# Patient Record
Sex: Male | Born: 1960 | Race: White | Hispanic: No | Marital: Married | Smoking: Never smoker
Health system: Southern US, Community
[De-identification: ages and names within clinical notes are randomized; demographics above are authoritative.]

## PROBLEM LIST (undated history)

## (undated) DIAGNOSIS — I10 Essential (primary) hypertension: Secondary | ICD-10-CM

## (undated) DIAGNOSIS — E119 Type 2 diabetes mellitus without complications: Secondary | ICD-10-CM

## (undated) DIAGNOSIS — Z8719 Personal history of other diseases of the digestive system: Secondary | ICD-10-CM

## (undated) DIAGNOSIS — Z7901 Long term (current) use of anticoagulants: Secondary | ICD-10-CM

## (undated) DIAGNOSIS — I4891 Unspecified atrial fibrillation: Secondary | ICD-10-CM

## (undated) DIAGNOSIS — J189 Pneumonia, unspecified organism: Secondary | ICD-10-CM

## (undated) DIAGNOSIS — N529 Male erectile dysfunction, unspecified: Secondary | ICD-10-CM

## (undated) DIAGNOSIS — N181 Chronic kidney disease, stage 1: Secondary | ICD-10-CM

## (undated) DIAGNOSIS — S22009A Unspecified fracture of unspecified thoracic vertebra, initial encounter for closed fracture: Secondary | ICD-10-CM

## (undated) DIAGNOSIS — S22008A Other fracture of unspecified thoracic vertebra, initial encounter for closed fracture: Secondary | ICD-10-CM

## (undated) DIAGNOSIS — A419 Sepsis, unspecified organism: Secondary | ICD-10-CM

## (undated) DIAGNOSIS — G4733 Obstructive sleep apnea (adult) (pediatric): Secondary | ICD-10-CM

## (undated) DIAGNOSIS — R17 Unspecified jaundice: Secondary | ICD-10-CM

## (undated) DIAGNOSIS — I482 Chronic atrial fibrillation, unspecified: Secondary | ICD-10-CM

## (undated) DIAGNOSIS — R310 Gross hematuria: Secondary | ICD-10-CM

## (undated) DIAGNOSIS — S42109A Fracture of unspecified part of scapula, unspecified shoulder, initial encounter for closed fracture: Secondary | ICD-10-CM

## (undated) DIAGNOSIS — E785 Hyperlipidemia, unspecified: Secondary | ICD-10-CM

## (undated) DIAGNOSIS — S2242XA Multiple fractures of ribs, left side, initial encounter for closed fracture: Secondary | ICD-10-CM

## (undated) DIAGNOSIS — J942 Hemothorax: Secondary | ICD-10-CM

## (undated) DIAGNOSIS — E876 Hypokalemia: Secondary | ICD-10-CM

## (undated) DIAGNOSIS — S36899A Unspecified injury of other intra-abdominal organs, initial encounter: Secondary | ICD-10-CM

## (undated) DIAGNOSIS — K8020 Calculus of gallbladder without cholecystitis without obstruction: Secondary | ICD-10-CM

## (undated) DIAGNOSIS — E118 Type 2 diabetes mellitus with unspecified complications: Secondary | ICD-10-CM

## (undated) DIAGNOSIS — I6350 Cerebral infarction due to unspecified occlusion or stenosis of unspecified cerebral artery: Secondary | ICD-10-CM

## (undated) DIAGNOSIS — D62 Acute posthemorrhagic anemia: Secondary | ICD-10-CM

## (undated) DIAGNOSIS — I639 Cerebral infarction, unspecified: Secondary | ICD-10-CM

## (undated) HISTORY — DX: Unspecified jaundice: R17

## (undated) HISTORY — DX: Cerebral infarction, unspecified: I63.9

## (undated) HISTORY — DX: Cerebral infarction due to unspecified occlusion or stenosis of unspecified cerebral artery: I63.50

## (undated) HISTORY — DX: Fracture of unspecified part of scapula, unspecified shoulder, initial encounter for closed fracture: S42.109A

## (undated) HISTORY — DX: Chronic kidney disease, stage 1: N18.1

## (undated) HISTORY — DX: Gross hematuria: R31.0

## (undated) HISTORY — DX: Hemothorax: J94.2

## (undated) HISTORY — DX: Other fracture of unspecified thoracic vertebra, initial encounter for closed fracture: S22.008A

## (undated) HISTORY — DX: Unspecified injury of other intra-abdominal organs, initial encounter: S36.899A

## (undated) HISTORY — DX: Unspecified atrial fibrillation: I48.91

## (undated) HISTORY — DX: Chronic atrial fibrillation, unspecified: I48.20

## (undated) HISTORY — DX: Obstructive sleep apnea (adult) (pediatric): G47.33

## (undated) HISTORY — DX: Hypokalemia: E87.6

## (undated) HISTORY — DX: Pneumonia, unspecified organism: J18.9

## (undated) HISTORY — DX: Hyperlipidemia, unspecified: E78.5

## (undated) HISTORY — DX: Essential (primary) hypertension: I10

## (undated) HISTORY — DX: Personal history of other diseases of the digestive system: Z87.19

## (undated) HISTORY — PX: TONSILLECTOMY: SHX5217

## (undated) HISTORY — DX: Male erectile dysfunction, unspecified: N52.9

## (undated) HISTORY — DX: Type 2 diabetes mellitus with unspecified complications: E11.8

## (undated) HISTORY — DX: Calculus of gallbladder without cholecystitis without obstruction: K80.20

## (undated) HISTORY — DX: Multiple fractures of ribs, left side, initial encounter for closed fracture: S22.42XA

## (undated) HISTORY — DX: Long term (current) use of anticoagulants: Z79.01

## (undated) HISTORY — DX: Type 2 diabetes mellitus without complications: E11.9

## (undated) HISTORY — DX: Acute posthemorrhagic anemia: D62

## (undated) HISTORY — DX: Sepsis, unspecified organism: A41.9

## (undated) HISTORY — DX: Unspecified fracture of unspecified thoracic vertebra, initial encounter for closed fracture: S22.009A

---

## 2004-05-17 ENCOUNTER — Inpatient Hospital Stay (HOSPITAL_COMMUNITY): Admission: EM | Admit: 2004-05-17 | Discharge: 2004-05-19 | Payer: Self-pay | Admitting: Emergency Medicine

## 2006-05-20 HISTORY — PX: CHOLECYSTECTOMY: SHX55

## 2007-06-30 ENCOUNTER — Inpatient Hospital Stay (HOSPITAL_COMMUNITY): Admission: EM | Admit: 2007-06-30 | Discharge: 2007-07-05 | Payer: Self-pay | Admitting: Emergency Medicine

## 2007-06-30 ENCOUNTER — Ambulatory Visit (HOSPITAL_COMMUNITY): Admission: RE | Admit: 2007-06-30 | Discharge: 2007-06-30 | Payer: Self-pay | Admitting: Gastroenterology

## 2007-06-30 ENCOUNTER — Encounter (INDEPENDENT_AMBULATORY_CARE_PROVIDER_SITE_OTHER): Payer: Self-pay | Admitting: Gastroenterology

## 2007-07-15 ENCOUNTER — Ambulatory Visit: Payer: Self-pay | Admitting: Cardiology

## 2007-07-15 ENCOUNTER — Ambulatory Visit (HOSPITAL_COMMUNITY): Admission: RE | Admit: 2007-07-15 | Discharge: 2007-07-16 | Payer: Self-pay | Admitting: Surgery

## 2007-07-22 ENCOUNTER — Ambulatory Visit: Payer: Self-pay

## 2007-07-22 ENCOUNTER — Encounter: Payer: Self-pay | Admitting: Cardiology

## 2007-08-13 ENCOUNTER — Encounter (INDEPENDENT_AMBULATORY_CARE_PROVIDER_SITE_OTHER): Payer: Self-pay | Admitting: Surgery

## 2007-08-13 ENCOUNTER — Ambulatory Visit (HOSPITAL_COMMUNITY): Admission: RE | Admit: 2007-08-13 | Discharge: 2007-08-13 | Payer: Self-pay | Admitting: Surgery

## 2007-08-21 ENCOUNTER — Encounter: Admission: RE | Admit: 2007-08-21 | Discharge: 2007-08-21 | Payer: Self-pay | Admitting: Surgery

## 2007-08-24 ENCOUNTER — Ambulatory Visit: Payer: Self-pay | Admitting: Internal Medicine

## 2007-08-24 ENCOUNTER — Inpatient Hospital Stay (HOSPITAL_COMMUNITY): Admission: EM | Admit: 2007-08-24 | Discharge: 2007-08-26 | Payer: Self-pay | Admitting: Emergency Medicine

## 2007-11-05 ENCOUNTER — Encounter: Payer: Self-pay | Admitting: Cardiovascular Disease

## 2007-11-05 LAB — CONVERTED CEMR LAB
ALT: 25 units/L
AST: 23 units/L
Albumin: 4.1 g/dL
BUN: 17 mg/dL
CO2: 32 meq/L
Calcium: 9.1 mg/dL
Chloride: 108 meq/L
Cholesterol: 164 mg/dL
Creatinine, Ser: 0.9 mg/dL
Direct LDL: 47 mg/dL
GFR calc Af Amer: 108.98 mL/min
GFR calc non Af Amer: 90.06 mL/min
HDL: 3.49 mg/dL
Potassium: 3.9 meq/L
Sodium: 144 meq/L
Total Bilirubin: 0.7 mg/dL
Total Protein: 6.8 g/dL
Triglyceride fasting, serum: 60 mg/dL

## 2009-03-20 ENCOUNTER — Encounter: Admission: RE | Admit: 2009-03-20 | Discharge: 2009-03-20 | Payer: Self-pay | Admitting: Family Medicine

## 2009-08-16 ENCOUNTER — Encounter: Payer: Self-pay | Admitting: Cardiovascular Disease

## 2009-08-17 ENCOUNTER — Encounter: Payer: Self-pay | Admitting: Cardiovascular Disease

## 2009-08-17 LAB — CONVERTED CEMR LAB
Basophils Relative: 0 %
Eosinophils Relative: 0 %
HCT: 43 %
Hemoglobin: 14.7 g/dL
INR: 0.99
Lymphocytes, automated: 16 %
MCV: 86 fL
Monocytes Relative: 5 %
Neutrophils Relative %: 79 %
Platelets: 193 10*3/uL
Prothrombin Time: 10 s
RBC: 5.02 M/uL
RDW: 13.6 %
WBC: 9.5 10*3/uL

## 2009-08-21 ENCOUNTER — Ambulatory Visit: Payer: Self-pay | Admitting: Cardiovascular Disease

## 2009-08-29 ENCOUNTER — Telehealth: Payer: Self-pay | Admitting: Cardiology

## 2009-08-30 DIAGNOSIS — I482 Chronic atrial fibrillation, unspecified: Secondary | ICD-10-CM

## 2009-08-30 DIAGNOSIS — I6350 Cerebral infarction due to unspecified occlusion or stenosis of unspecified cerebral artery: Secondary | ICD-10-CM | POA: Insufficient documentation

## 2009-08-30 DIAGNOSIS — R17 Unspecified jaundice: Secondary | ICD-10-CM | POA: Insufficient documentation

## 2009-08-30 DIAGNOSIS — I10 Essential (primary) hypertension: Secondary | ICD-10-CM

## 2009-08-30 DIAGNOSIS — I119 Hypertensive heart disease without heart failure: Secondary | ICD-10-CM

## 2009-08-30 DIAGNOSIS — E118 Type 2 diabetes mellitus with unspecified complications: Secondary | ICD-10-CM

## 2009-08-30 DIAGNOSIS — E785 Hyperlipidemia, unspecified: Secondary | ICD-10-CM

## 2009-08-30 DIAGNOSIS — Z8719 Personal history of other diseases of the digestive system: Secondary | ICD-10-CM

## 2009-08-30 DIAGNOSIS — N529 Male erectile dysfunction, unspecified: Secondary | ICD-10-CM

## 2009-08-30 DIAGNOSIS — N181 Chronic kidney disease, stage 1: Secondary | ICD-10-CM

## 2009-08-30 HISTORY — DX: Male erectile dysfunction, unspecified: N52.9

## 2009-08-30 HISTORY — DX: Chronic atrial fibrillation, unspecified: I48.20

## 2009-08-30 HISTORY — DX: Type 2 diabetes mellitus with unspecified complications: E11.8

## 2009-08-30 HISTORY — DX: Cerebral infarction due to unspecified occlusion or stenosis of unspecified cerebral artery: I63.50

## 2009-08-30 HISTORY — DX: Hyperlipidemia, unspecified: E78.5

## 2009-08-30 HISTORY — DX: Chronic kidney disease, stage 1: N18.1

## 2009-08-30 HISTORY — DX: Essential (primary) hypertension: I10

## 2009-08-30 HISTORY — DX: Personal history of other diseases of the digestive system: Z87.19

## 2009-08-30 HISTORY — DX: Unspecified jaundice: R17

## 2009-09-05 ENCOUNTER — Encounter: Payer: Self-pay | Admitting: Cardiovascular Disease

## 2009-09-13 ENCOUNTER — Telehealth (INDEPENDENT_AMBULATORY_CARE_PROVIDER_SITE_OTHER): Payer: Self-pay | Admitting: *Deleted

## 2009-09-13 ENCOUNTER — Encounter: Payer: Self-pay | Admitting: Cardiovascular Disease

## 2009-12-31 IMAGING — CR DG ERCP WO/W SPHINCTEROTOMY
5 series · 11 of 11 positions shown · non-contrast
Comparison: none

CLINICAL DATA: Gallstones. Abdominal pain.

ERCP:
Images during ERCP performed by the clinical service are submitted.

[Series 1: cont. · 2 of 2 slices shown (1 of 5)]
[im 1/2]
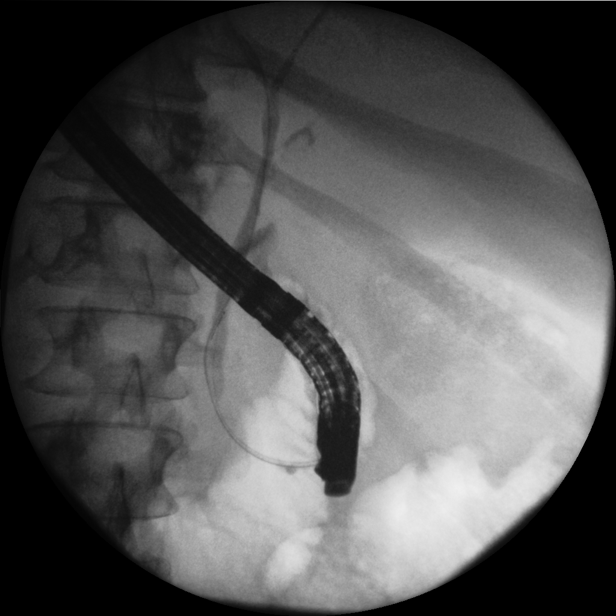
[im 2/2]
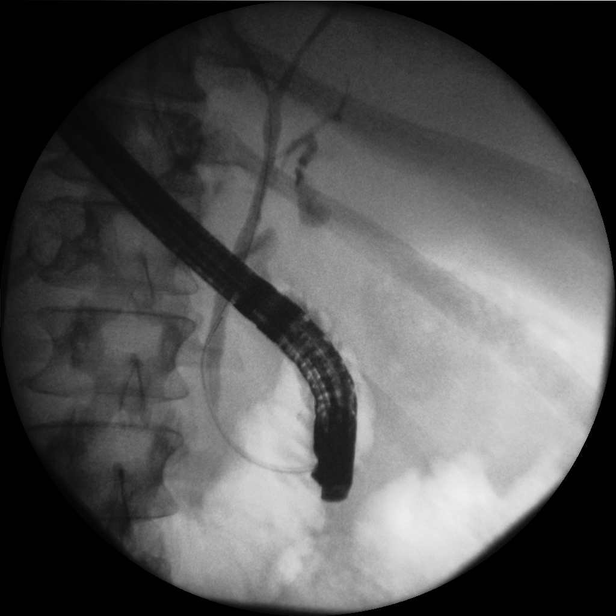

[Series 2: cont. · 2 of 2 slices shown (2 of 5)]
[im 1/2]
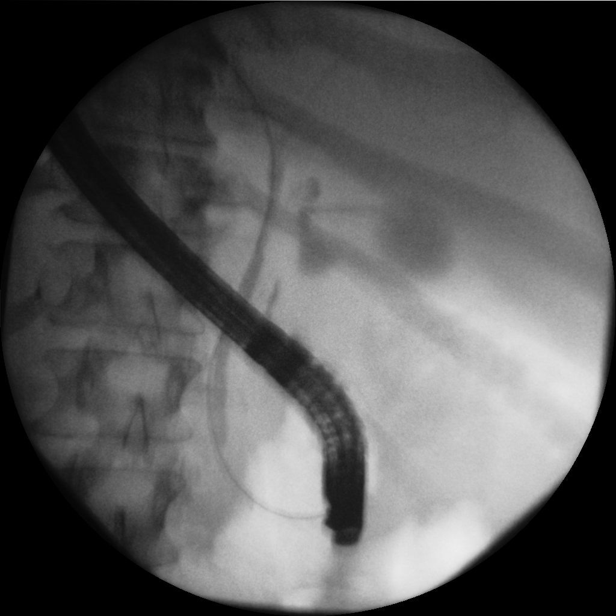
[im 2/2]
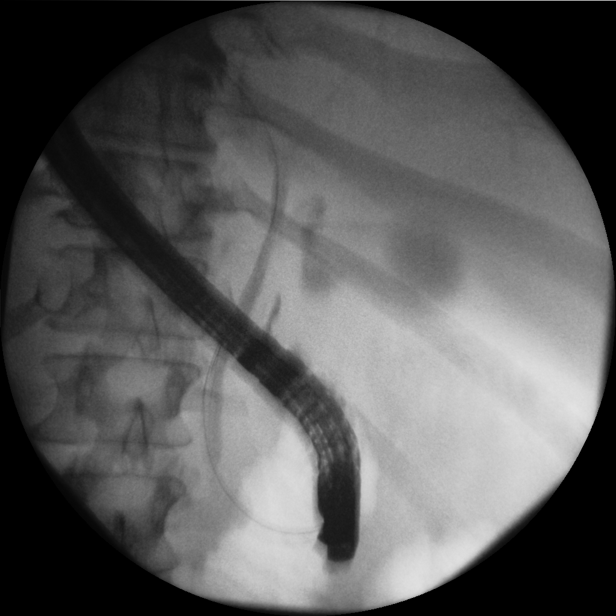

[Series 3: cont. · 2 of 2 slices shown (3 of 5)]
[im 1/2]
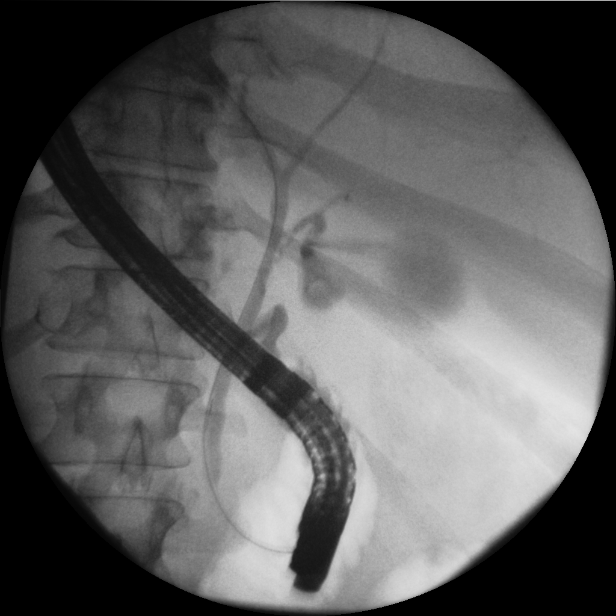
[im 2/2]
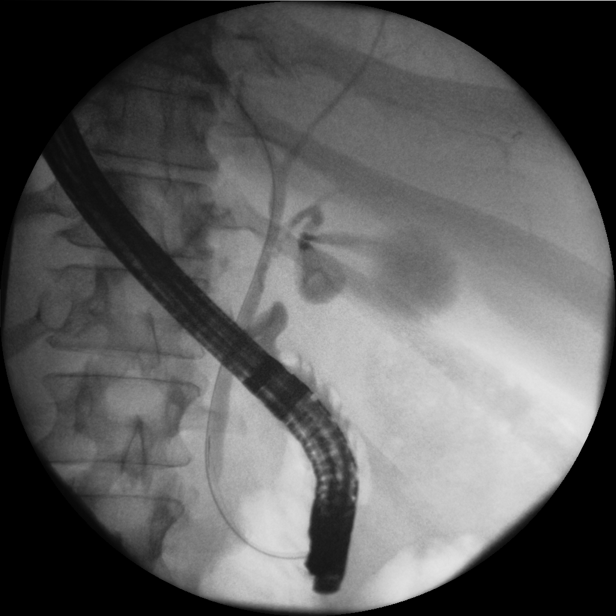

[Series 4: cont. · 4 of 4 slices shown (4 of 5)]
[im 1/4]
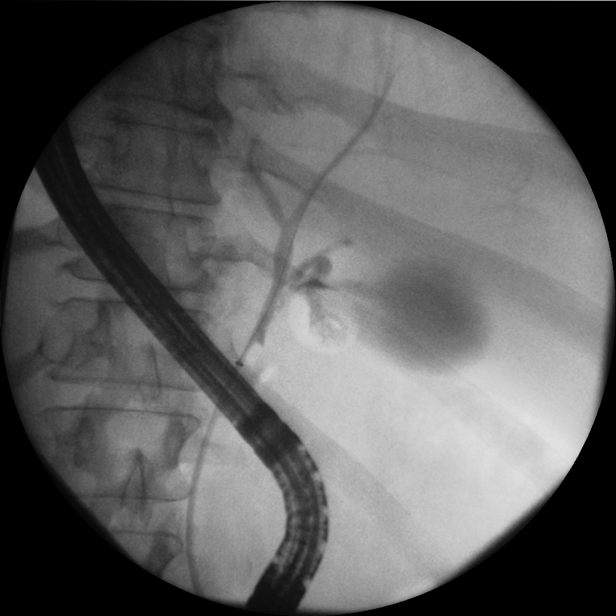
[im 2/4]
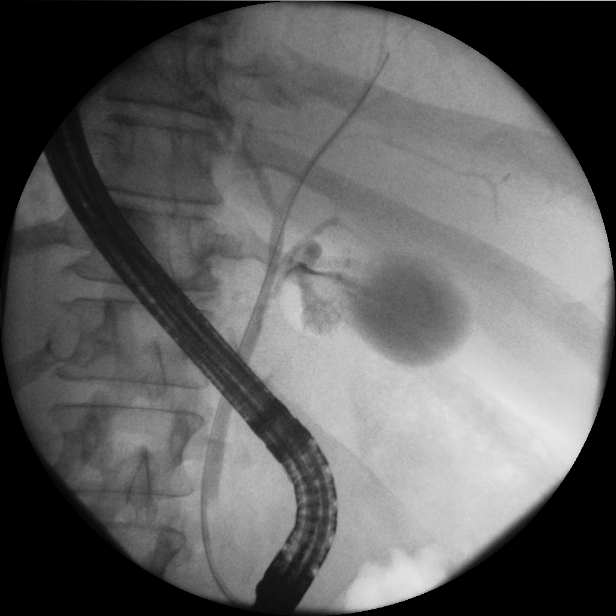
[im 3/4]
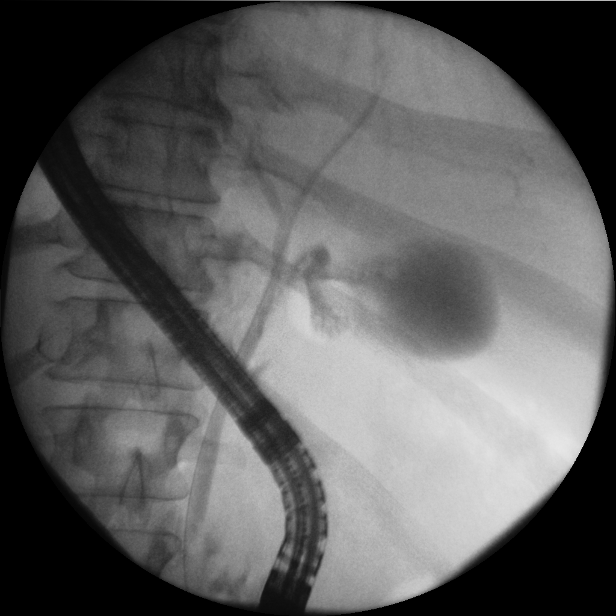
[im 4/4]
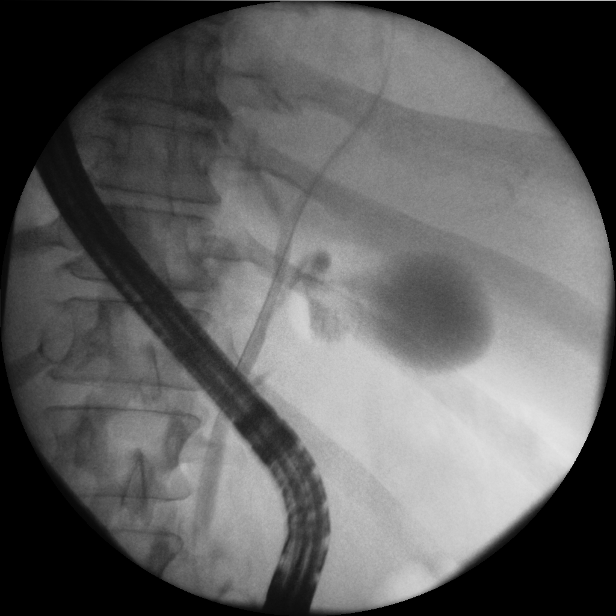

[cont. (5 of 5)]
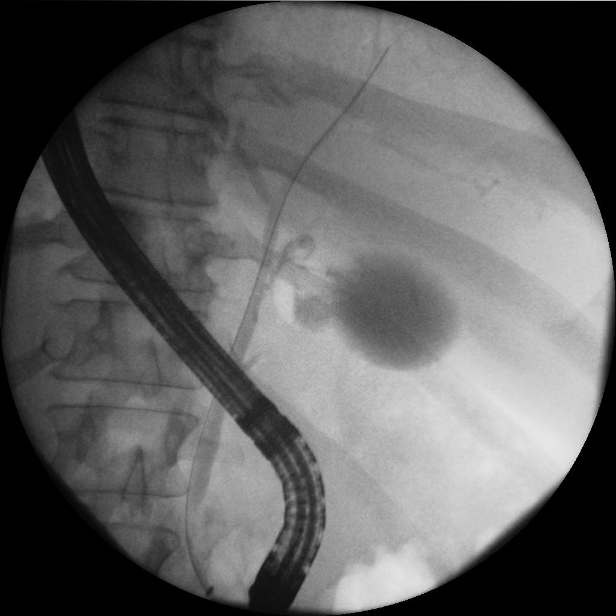

[11 of 11 positions shown; findings below may reference images not displayed]

FINDINGS: No prior for comparison.

Initial images demonstrate opacification of the common duct, cystic duct, and
likely the gallbladder. No persistent filling defects to suggest common duct
stone. Final images demonstrate a balloon being pulled through the common duct.
The distal common duct is poorly opacified throughout.
IMPRESSION: 1. No definite evidence of common duct stone. Minimally limited evaluation of
the distal common duct.

## 2010-01-15 IMAGING — CR DG CHEST 2V
2 series · 2 of 2 positions shown · non-contrast
Comparison: CT chest 05/18/04 and chest x-ray 05/17/04.

CLINICAL DATA: Preop laparoscopic cholecystectomy.
 CHEST - 2 VIEW:

[w chest pa]
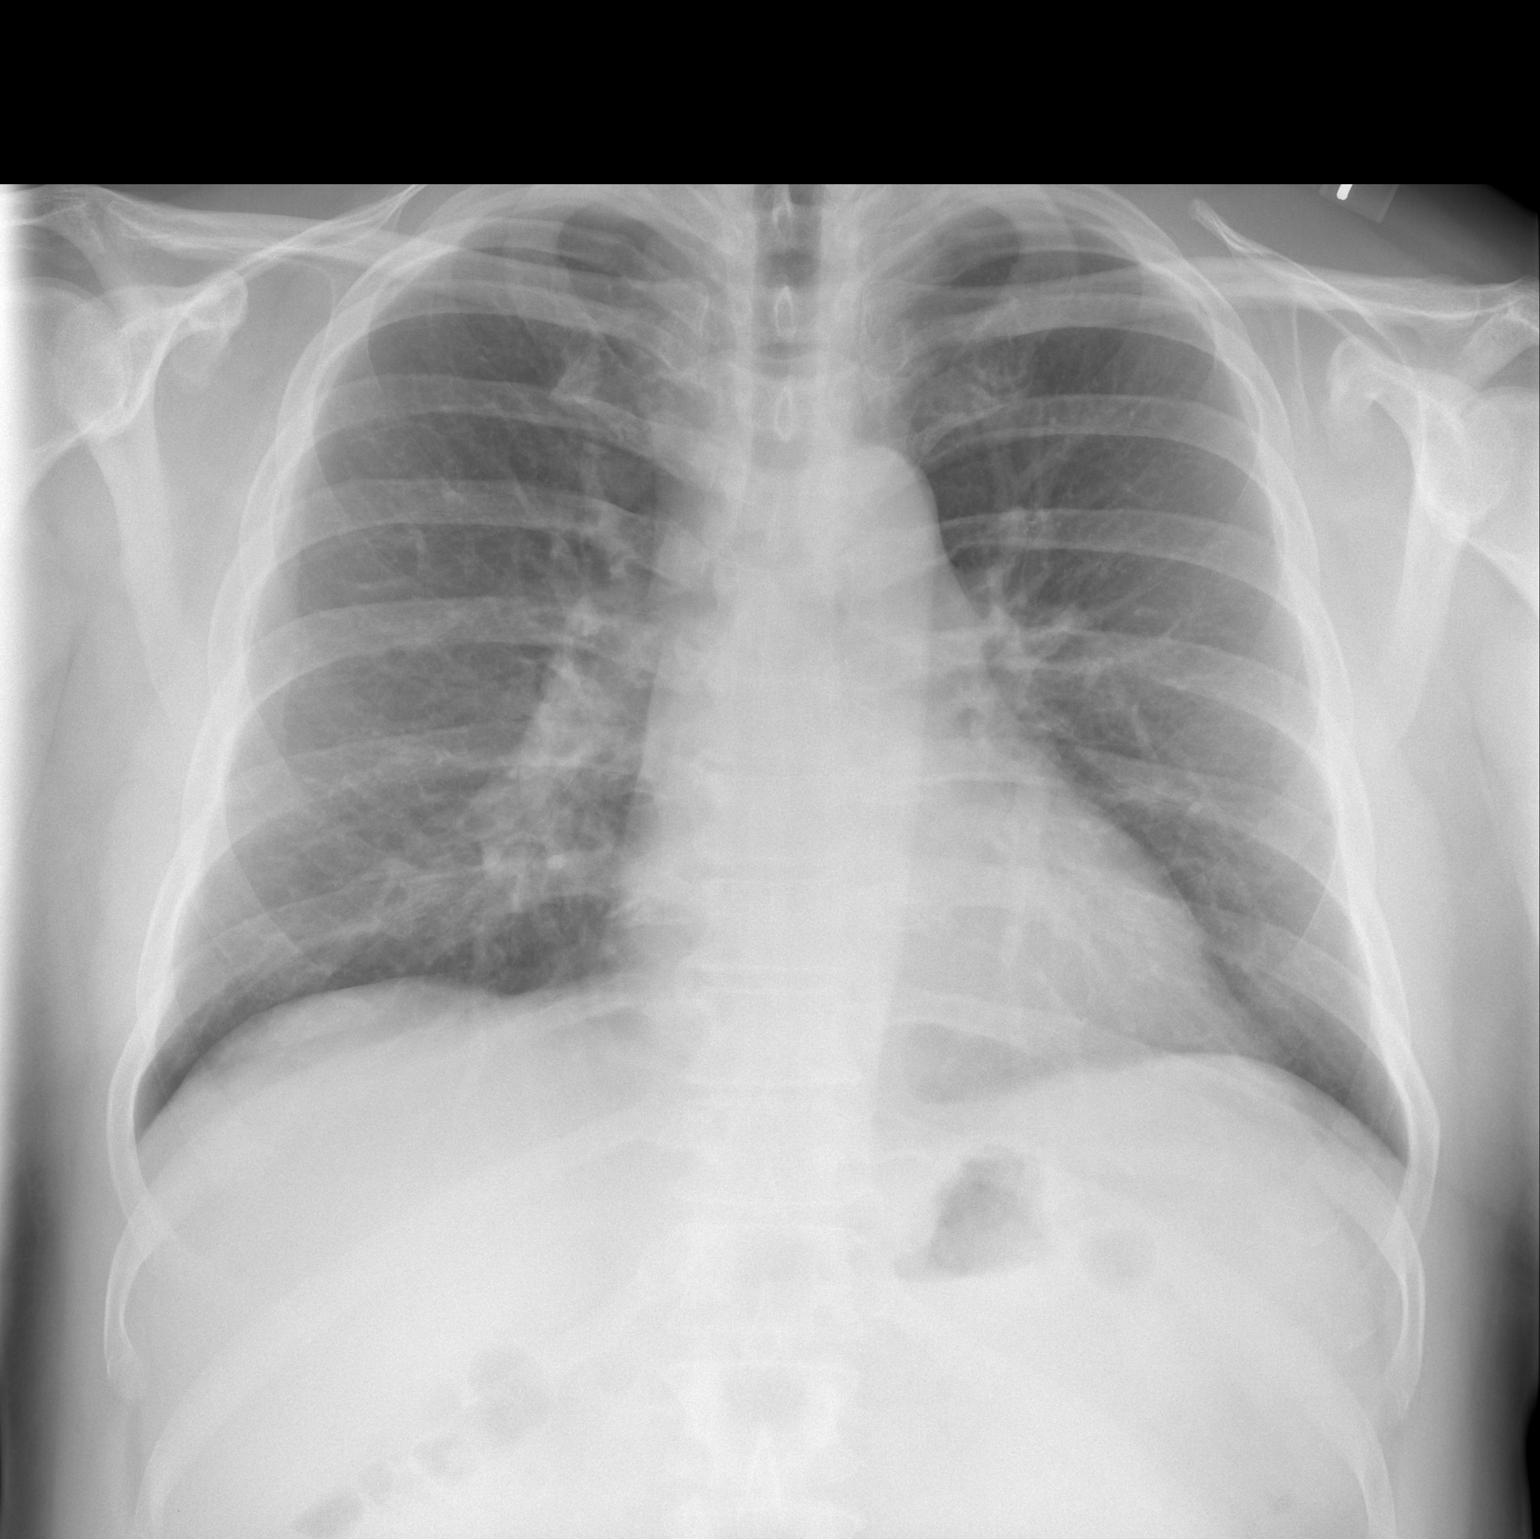

[w chest lat]
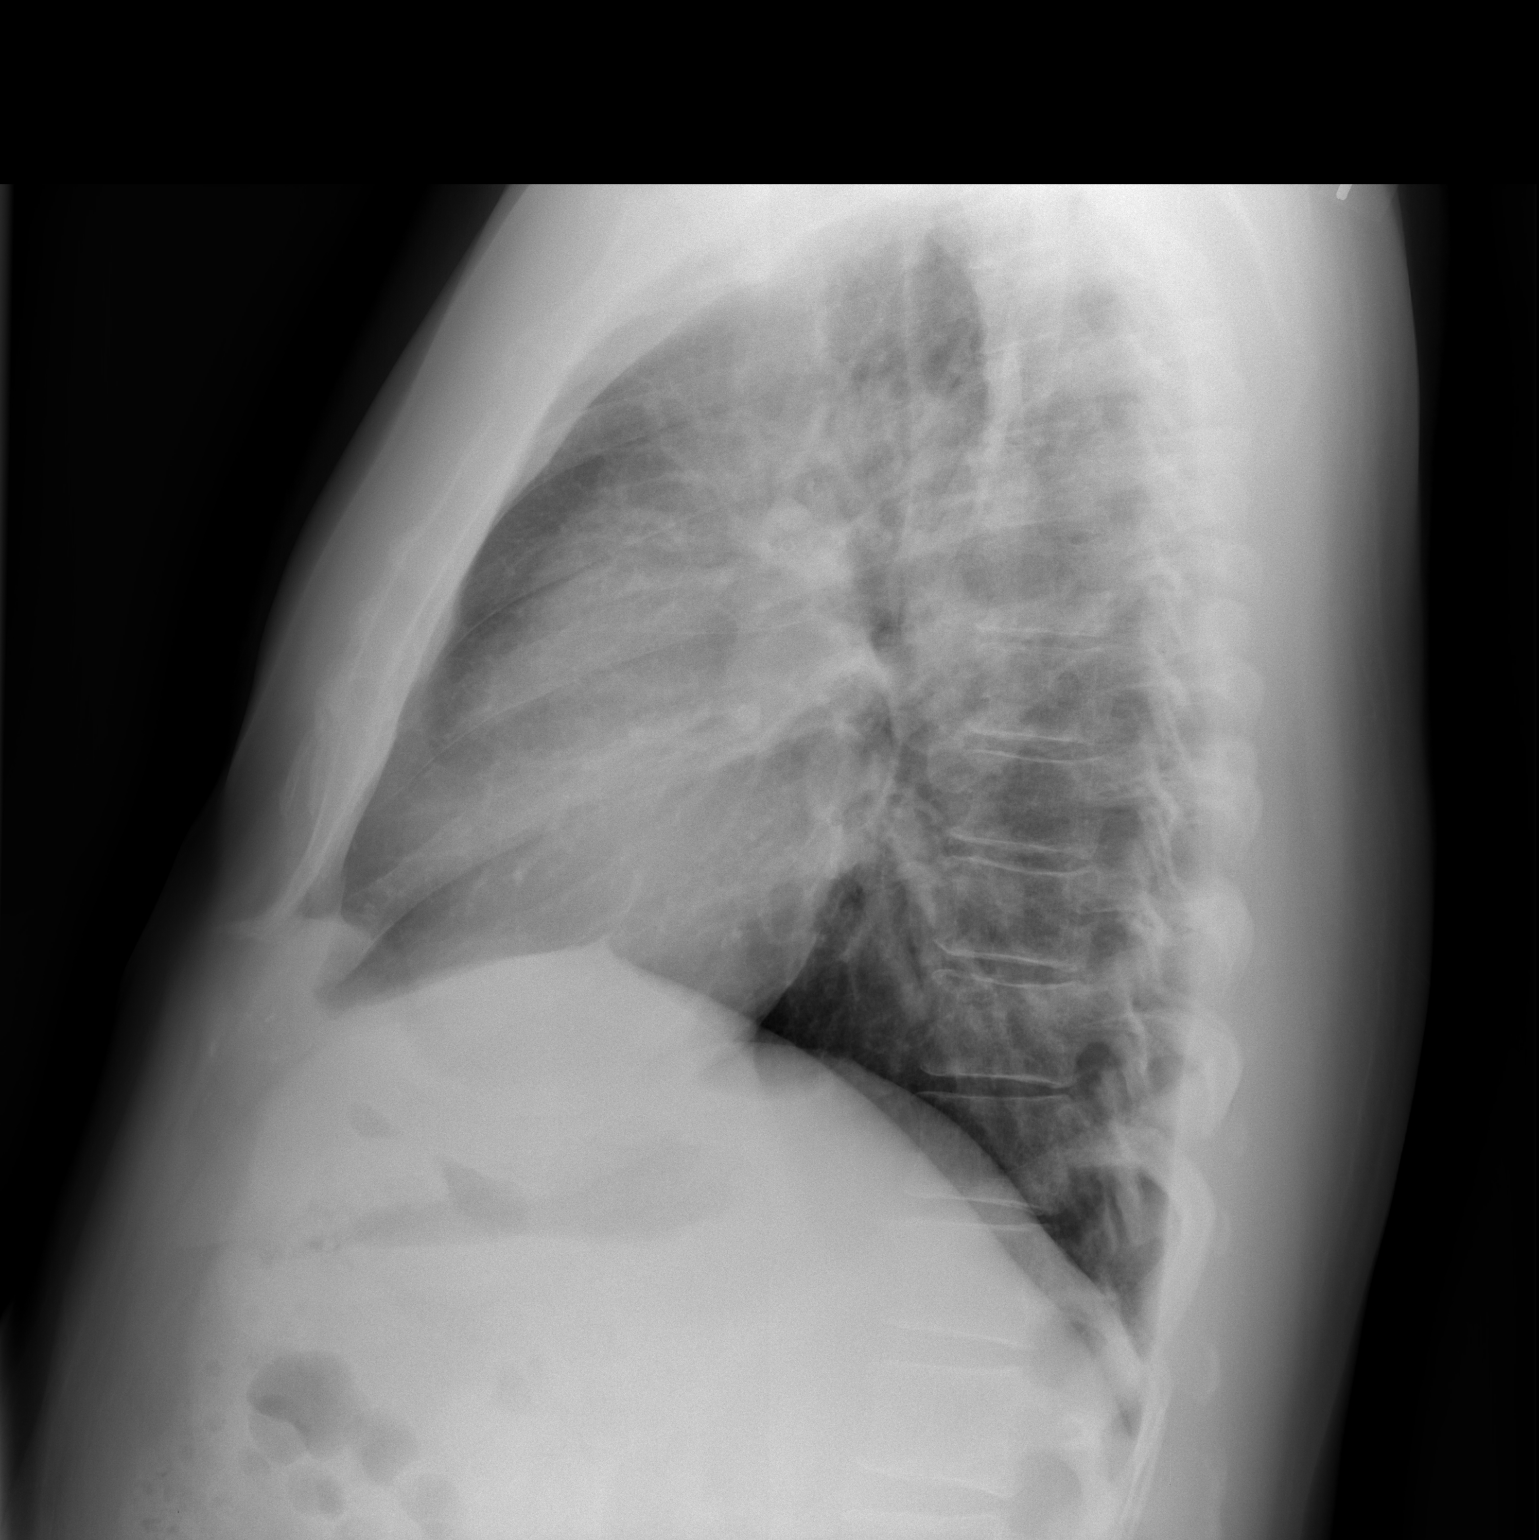

[2 of 2 positions shown; findings below may reference images not displayed]

FINDINGS: Trachea is midline.  Heart is at the upper limits of normal in size.  Lungs are clear.  No pleural fluid.
IMPRESSION: No acute findings.

## 2010-06-17 LAB — CONVERTED CEMR LAB: POC INR: 1.1

## 2010-06-19 NOTE — Progress Notes (Signed)
Summary: called for records   Phone Note Outgoing Call   Summary of Call: called [facility] for records. Charlena Cross RN BSN

## 2010-06-19 NOTE — Medication Information (Signed)
Summary: Coumadin Clinic   Anticoagulant Therapy  Managed by: Inactive Supervising MD: Mariah Milling Indication 1: Atrial Fibrillation Lab Used: [facility] [place] Site: [place] INR RANGE 2.0-3.0           Allergies: 1)  ! Lisinopril  Anticoagulation Management History:      Positive risk factors for bleeding include history of CVA/TIA and presence of serious comorbidities.  Negative risk factors for bleeding include an age less than 66 years old.  The bleeding index is 'intermediate risk'.  Positive CHADS2 values include History of HTN, History of Diabetes, and Prior Stroke/CVA/TIA.  Negative CHADS2 values include Age > 44 years old.  His last INR was 0.99.  Anticoagulation responsible provider: Bobbie Virden.    Anticoagulation Management Assessment/Plan:      The patient's current anticoagulation dose is Coumadin 5 mg tabs: as directed by anticoagulation.  The next INR is due 08/25/2009.  Anticoagulation instructions were given to patient.  Results were reviewed/authorized by Inactive.         Prior Anticoagulation Instructions: coumadin 5 mg T Th Sa Sun, coumadin 7.5 mg M W F

## 2010-06-19 NOTE — Letter (Signed)
Summary: Medical Record Release  Medical Record Release   Imported By: Harlon Flor 03/28/2010 15:32:51  _____________________________________________________________________  External Attachment:    Type:   Image     Comment:   External Document

## 2010-06-19 NOTE — Progress Notes (Signed)
   Phone Note Outgoing Call Call back at [facility] Phone [phone]   Call placed by: Harlon Flor,  September 13, 2009 1:43 PM Call placed to: Patient Summary of Call: PT IS NO LONGER ON COUMADIN Initial call taken by: Harlon Flor,  September 13, 2009 1:43 PM

## 2010-06-19 NOTE — Medication Information (Signed)
Summary: CCR/AMD  Medications Added LIPITOR 80 MG TABS (ATORVASTATIN CALCIUM) Take one tablet by mouth daily. METOPROLOL TARTRATE 50 MG TABS (METOPROLOL TARTRATE) Take one tablet by mouth twice a day ZESTORETIC 10-12.5 MG TABS (LISINOPRIL-HYDROCHLOROTHIAZIDE)  COUMADIN 5 MG TABS (WARFARIN SODIUM) as directed by anticoagulation ASPIRIN 81 MG TBEC (ASPIRIN) Take one tablet by mouth daily      Allergies Added: NKDA Anticoagulant Therapy  Managed by: Charlena Cross, RN, BSN Supervising MD: Mariah Milling Indication 1: Atrial Fibrillation Lab Used: [facility] [place] Site: [place] INR POC 1.1 INR RANGE 2.0-3.0  Dietary changes: no    Health status changes: yes       Details: pt had CVA on Wed @ store in [place].  In [place] [facility]  Bleeding/hemorrhagic complications: no    Recent/future hospitalizations: no    Any changes in medication regimen? no    Recent/future dental: no  Any missed doses?: no       Is patient compliant with meds? yes       Current Medications (verified): 1)  Lipitor 80 Mg Tabs (Atorvastatin Calcium) .... Take One Tablet By Mouth Daily. 2)  Metoprolol Tartrate 50 Mg Tabs (Metoprolol Tartrate) .... Take One Tablet By Mouth Twice A Day 3)  Zestoretic 10-12.5 Mg Tabs (Lisinopril-Hydrochlorothiazide) 4)  Coumadin 5 Mg Tabs (Warfarin Sodium) .... As Directed By Anticoagulation 5)  Aspirin 81 Mg Tbec (Aspirin) .... Take One Tablet By Mouth Daily  Allergies (verified): No Known Drug Allergies  Anticoagulation Management History:      The patient comes in today for his initial visit for anticoagulation therapy.  Negative risk factors for bleeding include an age less than 17 years old.  The bleeding index is 'low risk'.  Negative CHADS2 values include Age > 7 years old.  Anticoagulation responsible provider: Nahshon Reich.  INR POC: 1.1.    Anticoagulation Management Assessment/Plan:      The patient's current anticoagulation dose is Coumadin 5 mg tabs:  as directed by anticoagulation.  The next INR is due 08/25/2009.  Anticoagulation instructions were given to patient.  Results were reviewed/authorized by Charlena Cross, RN, BSN.  He was notified by Charlena Cross, RN, BSN.         Current Anticoagulation Instructions: coumadin 5 mg T Th Sa Sun, coumadin 7.5 mg M W F

## 2010-10-02 NOTE — Op Note (Signed)
[name], [name]              ACCOUNT NO.:  [id number]   MEDICAL RECORD NO.:  [id number]          PATIENT TYPE:  INP   LOCATION:  1237                         FACILITY:  [facility]   PHYSICIAN:  Angelia Mould. Derrell Lolling, M.D.DATE OF BIRTH:  1961-03-08   DATE OF PROCEDURE:  08/24/2007  DATE OF DISCHARGE:                               OPERATIVE REPORT   PREOPERATIVE DIAGNOSIS:  Small-bowel obstruction secondary to  incarcerated incisional hernia.   POSTOPERATIVE DIAGNOSIS:  Small-bowel obstruction secondary to  incarcerated incisional hernia.   OPERATION PERFORMED:  Laparotomy, release of small-bowel obstruction,  repair of ventral incisional hernia.   SURGEON:  Dr. Claud Kelp.   OPERATIVE INDICATIONS:  This is a 50 year old white man who underwent a  laparoscopic cholecystectomy with cholangiogram on August 13, 2007.  Since that time, he has had nausea and vomiting, almost on a daily  basis.  His bowel movements had become markedly diminished in number and  in volume and has he has not had a bowel movement and about 3 days now.  He came to the emergency room and was found to be hypotensive with acute  renal insufficiency, presumably due to volume depletion.  He was found  to have a little bit of tenderness around his umbilicus.  CT scan showed  a small bowel obstruction secondary to a single loop of small bowel  incarcerated in an umbilical incision.  He is also in atrial  fibrillation and has been evaluated by internal medicine.  After  receiving about 3-1/2 liters of fluid and some potassium and a medical  evaluation, he is brought to operating room urgently for laparotomy and  release of his bowel obstruction.   OPERATIVE TECHNIQUE:  Following the induction of general endotracheal  anesthesia, the patient's abdomen was prepped and draped in a sterile  fashion.  Intravenous antibiotics were given prior to the incision.  The  patient was identified as the correct patient and  correct procedure.   I observed a transverse incision at the lower rim of the umbilicus.  There was no inflammatory change in the skin.  I opened this incision  and extended it to the right and to the left so that the total incision  was probably about 8 cm in length.  Dissection was carried down  carefully with the subcutaneous tissue.  I found a single loop of small  bowel in the wound, but it was pink and viable without any signs of  ischemia.  It was almost a Richter's hernia.  I very carefully took the  dissection down to the right and the left.  Some inflammatory adhesions  were present, and I divided these carefully with blunt and sharp  dissection to mobilize the small bowel.  I could feel the edge of the  fascia and was able to insert a right angle clamp under this to slowly  incise the fascia on the right lateral side until I had an opening that  was big enough to let the small bowel back in.  I could then sweep my  finger underneath the fascia circumferentially.  I opened the fascia  for  a distance of about 6 or 7 cm.  I then pulled up the loop of small bowel  that was involved and found that the bowel was dilated, but there was no  signs of any ischemia or hemorrhage anywhere.  I returned the small  bowel to the abdominal cavity.  The pulled the omentum down over this,  although the omentum was quite foreshortened.  Under direct vision, I  placed six interrupted sutures of #1 Novofil to close the fascia.  As  mentioned above, the fascial incision was transverse and about 6 cm or  so.  After all six sutures of #1 Novofil were placed, I very carefully  lifted them up and then tied all of these sutures.  This wound was  irrigated copiously with saline.  There was no bleeding.  The repair  appeared quite solid and secure.  The umbilicus was tacked back to the  fascia with a 3-0 Vicryl suture.  The subcutaneous tissue was closed  with 3-0 Vicryl sutures and  the skin closed with  skin staples.  Clean bandages were placed, and the  patient taken recovery room in stable condition.  Estimated blood loss  was about 25 mL.  Complications none.  Sponge, needle and instrument  counts were correct.      Angelia Mould. Derrell Lolling, M.D.  Electronically Signed     HMI/MEDQ  D:  08/24/2007  T:  08/24/2007  Job:  [id number]   cc:   Wilmon Arms. Corliss Skains, M.D.  [address] [place] [address] [id number]  [place]

## 2010-10-02 NOTE — Op Note (Signed)
NAMEDARION, MILEWSKI              ACCOUNT NO.:  [id number]   MEDICAL RECORD NO.:  [id number]          PATIENT TYPE:  AMB   LOCATION:  DAY                          FACILITY:  [facility]   PHYSICIAN:  Wilmon Arms. Corliss Skains, M.D. DATE OF BIRTH:  1961/01/14   DATE OF PROCEDURE:  08/13/2007  DATE OF DISCHARGE:                               OPERATIVE REPORT   PREOPERATIVE DIAGNOSES:  1. Choledocholithiasis.  2. Status post endoscopic retrograde cholangiopancreatography      pancreatitis.   POSTOPERATIVE DIAGNOSES:  1. Choledocholithiasis.  2. Status post endoscopic retrograde cholangiopancreatography      pancreatitis.   PROCEDURE PERFORMED:  Laparoscopic cholecystectomy with intraoperative  angiogram.   SURGEON:  Wilmon Arms. Corliss Skains, M.D.   ASSISTANT:  Clovis Pu. Cornett, M.D.   ANESTHESIA:  General endotracheal.   INDICATIONS:  The patient is a 50 year old male who presented in early  February with elevated liver function tests and jaundice.  He was noted  to have gallstones and a possible common bile duct stone.  He underwent  ERCP but developed postoperative pancreatitis.  He has since recovered  from the pancreatitis.  He was initially scheduled for a laparoscopic  cholecystectomy a couple of weeks ago but in the preop holding area he  was noticed to be in atrial fibrillation.  The case was cancelled while  the underwent a cardiac workup.  His cardiac workup was negative.  The  patient now presents for elective cholecystectomy.   DESCRIPTION OF PROCEDURE:  The patient brought to the operating room and  placed in the supine position on the operating room table.  After an  adequate level of general anesthesia was obtained, the patient's abdomen  was shaved, prepped with Betadine and draped in sterile fashion.  A time-  out was taken assure the proper patient and proper procedure.  He had  been given preoperative antibiotics.  The area below his umbilicus was  infiltrated with 0.25%  Marcaine with epinephrine.  A transverse incision  was made below the umbilicus.  Dissection was carried down to the  fascia, which was opened vertically.  A stay suture of 0 Vicryl as  placed around the fascial opening.  Pneumoperitoneum was obtained by  inserting the Hasson cannula, securing it with a suture and insufflating  CO2, maintaining a maximum pressure of 15 mmHg.  The patient was  positioned in reverse Trendelenburg tilted to his left.  An 11-mm port  was placed in the subxiphoid position.  Two 5-mm ports were placed in  the right upper quadrant.  We switched to the 30-degree angled scope.  The gallbladder was grasped with a clamp and elevated over the edge of  the liver.  We opened the peritoneum around the hilum of the  gallbladder.  The cystic duct was circumferentially dissected and  ligated and clipped distally.  A small opening was created on the cystic  duct.  A Cook cholangiogram catheter was then inserted through a stab  incision, threaded into the cystic duct.  It was secured with a clip.  A  cholangiogram was obtained, which showed good flow proximally and  distally in the biliary tree with no sign of obstruction or filling  defect.  The catheter was removed and the cystic duct was ligated with  clips and divided.  The cystic artery was ligated with clips and  divided.  Cautery was then used to remove the gallbladder from the liver  bed.  The gallbladder was placed in an EndoCatch sac.  We inspected the  gallbladder fossa for hemostasis.  Hemostasis was obtained with cautery.  We irrigated thoroughly and suctioned out as much irrigation as  possible.  The patient was noted have a small amount of thin, milky  fluid in the right lower quadrant.  This may be some post-pancreatitis  chylous ascites.  However, this fluid appeared fairly minimal and there  was no sign of inflammation in the right lower quadrant.  The  gallbladder was then removed through the umbilical port  site.  The  fascia was closed with the stay suture.  We once again reinspected the  right upper quadrant and no bleeding was noted.  Pneumoperitoneum was  then released as the trocars were removed.  Monocryl 4-0 was used to  close all the skin incisions.  Steri-Strips and clean dressings were  applied.  The patient was explained and brought to recovery in stable  condition.  All sponge, instrument and needle counts were correct.      Wilmon Arms. Tsuei, M.D.  Electronically Signed     MKT/MEDQ  D:  08/13/2007  T:  08/14/2007  Job:  [id number]   cc:   Emeterio Reeve, MD  Fax: [phone]   Petra Kuba, M.D.  Fax: [phone]

## 2010-10-02 NOTE — H&P (Signed)
NAMERAMIEL, FORTI              ACCOUNT NO.:  [id number]   MEDICAL RECORD NO.:  [id number]          PATIENT TYPE:  INP   LOCATION:  1237                         [facility]ACILITY[facility]  [facility]   PHYSICIAN:  Angelia Mould. Derrell Lolling, M.D.DATE OF BIRTH:  [birth year]   DATE OF ADMISSION:  08/23/2007  DATE OF DISCHARGE:                              HISTORY & PHYSICAL   CHIEF COMPLAINT:  Nausea, vomiting, chest pain, abdominal pain.   HISTORY OF PRESENT ILLNESS:  This is a 50 year old white man who was  evaluated in February 2009 because of elevated liver function tests,  symptomatic gallstones, and possible bile duct stones.  He underwent  ERCP and sphincterotomy on June 30, 2007, and he developed  pancreatitis following that procedure.  He had to be hospitalized for  several days until the pancreatitis settled down.  He was scheduled for  elective cholecystectomy, but when he came to the hospital for his  cholecystectomy he was found to be in atrial fibrillation with rapid  ventricular response, and the surgery was cancelled.  He was evaluated  by Dr. Rollene Rotunda, who did an echocardiogram which showed mild left  ventricular hypertrophy, a stress perfusion scan which was negative, and  an ejection fraction of 55%-60%.  It does not appear that he was put on  any antiarrhythmic drugs, but is not clear whether he converted or not.   He subsequently underwent an elective laparoscopic cholecystectomy with  cholangiogram on August 13, 2007.  He went home the next day.  He has had  a lot of nausea and vomiting since, almost every day, and has not had  much in the way of bowel movement, just a few bowel movements which were  very small in volume.  He has had no bowel movement for the past 3 days.   He came to the emergency room tonight complaining of dehydration,  nausea, vomiting, abdominal pain, and chest pain.   In the emergency room, he was found to be in atrial fibrillation, with  rapid ventricular  response and hypotension.  He was evaluated by Dr.  Margarita Grizzle.  He has received 3000 mL of fluids and is starting to get  had some potassium.  Dr. Rosalia Hammers called critical care medicine and [facility] and me.   A CT scan has been done which shows a small incarcerated incisional  hernia at the umbilicus and what looks like a bowel obstruction related  to this hernia.  There is no free fluid.  There is no free air.  There  is no sign of any fluid under the liver or any direct complications at  the operative site of his cholecystectomy.  This appears to be an acute  incisional hernia with bowel obstruction.  He is being admitted and  prepared for surgery this evening pending medical and cardiac  evaluation.   PAST HISTORY:  1. Borderline diabetes.  2. Hypertension.  3. Hyperlipidemia.  4. History of tonsillectomy.  5. Prior history of hospitalization for community-acquired pneumonia/  6. Recent onset atrial fibrillation.  7. Recent laparoscopic cholecystectomy with cholangiogram on August 13, 2007.  8. Recent ERCP with sphincterotomy June 30, 2007, complicated by      postprocedure pancreatitis   CURRENT MEDICATIONS:  1. Exforge 10/320 once a day for hypertension.  2. Reglan 10 mg before meals.  3. Phenergan 25 mg as needed.   DRUG ALLERGIES:  None known   FAMILY HISTORY:  Mother deceased of small bowel cancer and possibly bile  duct obstruction.  Father died of a brain tumor.  There was a  grandmother who had stomach cancer.   SOCIAL HISTORY:  Married.  They have three biological children.  I think  they have an adopted child as well.  He works as a Production designer, theatre/television/film for [employer].  Denies tobacco.  Drinks alcohol socially.   REVIEW OF SYSTEMS:  A 15-system review of systems was performed and is  noncontributory, except as described above.   PHYSICAL EXAMINATION:  GENERAL: Alert, cooperative white man who appears  in mild to moderate distress.  He  appears a little bit frustrated.  VITAL SIGNS:  Temperature 96.6, heart rate is between 88-125, atrial  fibrillation, respirations are 20, blood pressure 85/50 and 96/42.  HEENT:  Eyes:  Sclerae clear.  Extraocular movements intact.  Ears,  nose, mouth, and throat:  Nose, lips, tongue, and oropharynx are without  gross lesions.  NECK:  Supple, nontender.  No mass.  No jugular venous distention.  LUNGS:  Clear to auscultation.  No chest wall tenderness.  HEART:  Irregularly irregular rhythm.  No murmurs noted.  Radial and  femoral pulses are palpable.  ABDOMEN:  Somewhat obese, and also a little bit distended.  There is a  small tender mass at the umbilicus consistent with his incarcerated  hernia.  There is no evidence of infection.  This is a little bit  tender.  The remainder of the abdomen is actually quite soft and  nontender.  The rest of the trocar sites look good.  GENITOURINARY:  Penis looks normal.  There is no inguinal mass or  inguinal hernia.  EXTREMITIES:  He moves all four extremities well, without pain or  deformity.  NEUROLOGIC:  No gross motor or sensory deficits.   ADMISSION DATA:  Hemoglobin 16.4, white blood cell count 18,700,  platelet count 234,000.  Sodium 124, potassium 3, chloride 85, CO2 25,  BUN 55, creatinine 2.34, glucose 234.  Cardiac enzymes are normal.  Urinalysis shows some red cells, but he has a Foley catheter in place.   ASSESSMENT:  1. Small-bowel obstruction secondary to incarcerated periumbilical      ventral incisional hernia.  2. Hypovolemic shock secondary to nausea and vomiting and dehydration.  3. Atrial fibrillation. with rapid ventricular response.  4. Hypokalemia.  5. Diabetes mellitus.  6. Hypertension.   PLAN:  1. The patient will undergo resuscitation with IV fluids and potassium      in an attempt to restore his intravascular volume.  2. He will be started on IV antibiotics.  3. A nasogastric tube will be placed.  4. We are  going to get an emergency medical consultation to see if he      needs any pharmacologic control of his atrial fibrillation.  5. He will be taken to the operating room tonight for exploration of      his periumbilical wound.  Most likely, I will do this through a      transverse incision.  He may or may not require a bowel resection,      and  we will need to repair the fascia.   I have discussed the indication and details of this surgery with the  patient and his wife.  Risks and complications have been outlined,  including but not limited to bleeding, infection, injury to adjacent  organs such as the intestine, recurrence of the hernia, cardiac,  pulmonary and thromboembolic problems.  He seems to understand these  issues well.  At this time, all of his questions are answered.  He is in  full agreement with this plan.      Angelia Mould. Derrell Lolling, M.D.  Electronically Signed     HMI/MEDQ  D:  08/24/2007  T:  08/24/2007  Job:  [id number]   cc:   Wilmon Arms. Corliss Skains, M.D.  [address] [place] [address] [place] [address]  [facility]   Rollene Rotunda, MD, [facility]  [address]  [address]  [place]  [place] [address]   Emeterio Reeve, MD  Fax: [phone]

## 2010-10-02 NOTE — Consult Note (Signed)
[name], BACHUS NO.:  [id number]   MEDICAL RECORD NO.:  [id number]          PATIENT TYPE:  INP   LOCATION:  0103                         FACILITY:  [facility]   PHYSICIAN:  Therisa Doyne, MD    DATE OF BIRTH:  1960-10-12   DATE OF CONSULTATION:  DATE OF DISCHARGE:                                 CONSULTATION   PRIMARY CARE [name]:  [facility] - Dr. Laurine Blazer.   REFERRING PHYSICIAN:  Angelia Mould. Derrell Lolling, M.D.   REASON FOR CONSULTATION:  I was asked to see this patient by Dr. Claud Kelp of [facility].  I was asked to see this patient for  preoperative evaluation of atrial fibrillation with rapid ventricular  response.   HISTORY OF THE PRESENT ILLNESS:  The patient is a 50 year old white male  with a past medical history significant for pre-diabetes and  hypertension, and a recent episode of gallstone pancreatitis requiring  ERCP.  He was scheduled for an elective laparoscopic cholecystectomy on  July 15, 2007; however, at that time he was found to be in atrial  fibrillation.  [place] cardiology was consulted and evaluated the  patient.  Their workup included a transesophageal echocardiogram, which  revealed and ejection fraction of 55% and left ventricular hypertrophy.  A stress test was performed and this was negative.  His heart rate was  well-controlled and no new medications were started.  This patient also  underwent laparoscopic cholecystectomy on August 13, 2007.  Since that  time the patient has had progressive nausea,  vomiting and abdominal  discomfort.  He came into the emergency department for evaluation where  he was found to have an incarcerated midline hernia with subsequent  small bowel obstruction.  The patient was also noted to be in atrial  fibrillation with rapid ventricular response; his initial heart rate was  approximately 115 and his blood pressure was 85/50.  He was volume  resuscitated with 3 liters of IV  fluids and his heart rate improved, and  it is now currently in the 90s.  He is without shortness of breath or  chest pain at this time.   REVIEW OF SYSTEMS:  All systems are reviewed and are negative, except as  mentioned above in the history of present illness.   PAST MEDICAL HISTORY:  1. Hypertension.  2. Pre-diabetes.   SOCIAL HISTORY:  The patient denies tobacco, alcohol and drugs.   FAMILY HISTORY:  There is no family history of coronary artery disease.   ALLERGIES:  No known drug allergies.   MEDICATIONS:  1. Ex-forge.  2. Reglan.   PHYSICAL EXAMINATION:  VITAL SIGNS:  Temperature 96.6, blood pressure  100/70, pulse 95, respirations 24, and oxygen saturation 98% on room  air.  GENERAL APPEARANCE:  In general this is an ill-appearing white male.  HEENT:  Pupils are equal, round and react to light and accommodation.  Extraocular movements are intact.  Oropharynx reveals dry mucous  membranes.  NECK:  The neck is supple.  No lymphadenopathy.  No jugular venous  distention.  HEART:  Cardiovascular - tachycardiac, irregularly irregular.  No rubs  or gallops. There is a 2/6 systolic murmur heard at the left lower  sternal border.  CHEST:  The chest is clear to auscultation bilaterally.  ABDOMEN:  The abdomen is positive for bowel sounds.  Tender to palpation  in the epigastrium.  EXTREMITIES:  The extremities show no clubbing, cyanosis or edema.   LABORATORY VALUES:  White blood cell count 18.7.  Sodium 124, potassium  3, chloride 85, bicarb 25, BUN 55, creatinine 2.3, and glucose 234.  Troponin negative.  EKG; atrial fibrillation with rapid ventricular  response at approximately 115 beats per minute.  No acute ST or T wave  changes.   ASSESSMENT AND PLAN:  This is a 50 year old white male with a past  medical history significant for hypertension and diabetes who presents  with a small bowel obstruction secondary to an incarcerated hernia  requiring emergency surgery.   All this is in the setting of atrial  fibrillation with rapid ventricular response, leukocytosis, acute renal  failure, hypokalemia, and hyponatremia.  From a cardiovascular  standpoint, the patient is moderate risk for his surgery given his  current clinical status; however, his current clinical status (i.e.,  Atrial fibrillation with rapid ventricular response) is being driven by  the fact that he is volume depleted from his small bowel obstruction and  inability to tolerate by mouth; thus, surgery is, in fact, indicated,  even though the patient is at moderate risk for surgery.   Plan:  1. Atrial fibrillation with rapid ventricular response.  This is      likely secondary to volume depletion.  His heart rate has improved      with IV fluids and is currently in the high 90s. I have discussed      the case with Dr. Oneita Hurt from [facility] cardiology on call,      who agrees with these recommendations at this time.  We will not      use any pharmacological means to try to rate control the patient as      his underlying tachycardia is from his volume depletion; therefore,      we will treat his volume depletion with aggressive IV fluids and      volume resuscitation.   1. Small bowel obstruction and hernia.  Per general surgery's      management.   1. Leukocytosis.  This is likely being driven by his abdominal process      and possible underlying abdominal infection, which could include      infarcted or ischemic bowel.  Continue antibiotics per general      surgery.   1. Acute renal failure.  Likely secondary to volume depletion.  We      would recommend complete workup with extended urinalysis,      fractional excretion of his sodium, protein to creatinine ratio.      Monitor daily ins and outs.  Check daily basic metabolic profiles      and continue intravenous.   1. Hyponatremia.  This likely is secondary to volume depletion.  We      will, however, recommend serum  osmolality, urine osmolality and      check a urine sodium.  Continue volume resuscitation.   1. This case was discussed with Dr. Derrell Lolling of general surgery.  He and      the Hospitalist Team will follow in consultation.   It has been a pleasure to participate in this patient's care.  Please  contact  us if you any further questions.      Therisa Doyne, MD  Electronically Signed     SJT/MEDQ  D:  08/24/2007  T:  08/24/2007  Job:  [id number]

## 2010-10-02 NOTE — Op Note (Signed)
[name], SARVER NO.:  [id number]   MEDICAL RECORD NO.:  [id number]          PATIENT TYPE:  OUT   LOCATION:  OMED                         FACILITY:  [facility]   PHYSICIAN:  Petra Kuba, M.D.    DATE OF BIRTH:  Apr 22, 1961   DATE OF PROCEDURE:  DATE OF DISCHARGE:                               OPERATIVE REPORT   SURGEON:  Petra Kuba, M.D.   PROCEDURE:  Endoscopic retrograde cholangiopancreatography,  sphincterotomy, balloon pull through and biopsy.   INDICATION:  The patient with gall stones, elevated liver tests,  question of COMMON BILE DUCT stones.  Dr. Anna Genre requesting preop ERCP.  Consent was signed after risks, benefits, methods, options were  thoroughly discussed with the patient and his wife in my office.   MEDICINES USED:  Fentanyl 125 mcg, versed 10 mg.   PROCEDURE:  The side-viewing therapeutic video duodenoscope was inserted  by indirect vision into the stomach and advanced through a normal antrum  and pylorus.  A moderately bulbous sized ampulla was brought into view.  Using the triple-lumen sphincterotome lettered with the jag wire, the  initial attempts at wire advancements could not be advanced into the  CBD.  A little dye was injected which did show some very minimal CBD  filling and some very minimal pancreatic duct filling.  Once we had  obtained the minimal injections, the injection was stopped.  Neither  duct was overfilled.  The wire was then able to be advanced into theCBD  and deep selective cannulation was obtained.  On the initial  cholangiogram, there was question of a small air bubble versus a small  stone, but otherwise theCBD was normal.  The cystic duct was patent.  On  quick evaluation, the intrahepatic appeared normal.  We went ahead and  proceeded with a moderate sphincterotomy in the customary fashion over  the wire until adequate biliary drainage was obtained and exchanged the  catheter tip and  could get the  sphincterotome easily in and out of the  duct.  We exchanged the sphincterotome for the adjustable 12 to 15 mm  balloon and proceeded with multiple 12 mm balloon pull throughs.  The  balloon passed readily through the patent sphincterotomy site without  resistance.  We went ahead and proceeded with an occlusion cholangiogram  in the customary fashion,  which did not reveal any residual stones.  At  this junction, the balloon and wire were removed.  We did go ahead and  take one biopsy of the bulbous ampulla just to make sure this was not  due to anything but edema from probably passing a stent.  The procedure  was terminated at this junction, scope was removed.  The patient  tolerated the procedure well.  There was no obvious immediate  complication.   ENDOSCOPIC DIAGNOSES:  1. Bulbous ampulla status post biopsy.  2. Minimal one PD injection overfilled.  Injection stopped once PD was      recognized and possible wired into the pancreas one time not      mentioned above.  3. NormalCBD, questionable tiny stenting versus  air bubble on initial      injection.  4. Patent cystic duct.  5. Status post moderate-sized sphincterotomy and multiple 12 mm      adjustable balloon pull throughs without stones or debris seen.  6. Negative occlusive cholangiogram at the end of the procedure.   PLAN:  Observe for delayed complications.  If  none, I have discussed  these with Dr. Anna Genre.  Will schedule him for elective laparoscopic  cholecystectomy.  Await biopsies in the meantime and asked to see back  p.r.n.  No aspirin or nonsteroidals for two weeks.           ______________________________  Petra Kuba, M.D.     MEM/MEDQ  D:  06/30/2007  T:  07/01/2007  Job:  [id number]   cc:   Petra Kuba, M.D.  Fax: [phone]   Johnn Hai   [facility]

## 2010-10-02 NOTE — H&P (Signed)
[name], [name]              ACCOUNT NO.:  [id number]   MEDICAL RECORD NO.:  [id number]          PATIENT TYPE:  INP   LOCATION:  1619                         [facility]AC[facility]   PHYSICIAN:  Shirley Friar, MDDATE OF BIRTH:  05-09-1961   DATE OF ADMISSION:  06/30/2007  DATE OF DISCHARGE:                              HISTORY & PHYSICAL   DIAGNOSES:  1. Acute pancreatitis.  2. Gallstones.  3. Elevated LFTs secondary to #2.   HISTORY OF PRESENT ILLNESS:  Mr. [name] is a 50 year old white male who is  evaluated by Dr. Ewing Schlein on June 29, 2007, secondary to elevated liver  function tests symptomatic gallstones and possible CBD stone.  He  underwent an ERCP on June 30, 2007, where he had a sphincterotomy  done with minimal pancreatic duct injection but no definite CVA stone  was identified.  Post procedure, he did well in the hospital, leaving  the hospital around noon.  However, around 2 o'clock shortly after  eating some mashed potatoes, he developed acute onset of sharp  epigastric pain 8/10 in intensity with 10 being the worst pain in his  life.  This pain was associated with persistent vomiting where he  vomited over 10 times.  The pain was mainly constant at 8/10 in  intensity but had waves of 10/10 in pain.  He contacted me by phone  being on call doctor, and I recommended he come to the emergency room to  get further evaluated.  On June 23, 2088, his alkaline phosphatase  199, AST 747, ALT 867, bilirubin was 4.6.  Lipase 14.  On the morning of  June 30, 2007, prior to his procedure, his ALP was 136, AST 66, ALT  255 with a bilirubin of 0.8.  Currently, he has a lipase of 2,885, ALP  134, AST 53, ALT 229.  Ultrasound was done tonight which showed positive  gallstones and adenomyomatosis of the gallbladder but no cholecystitis  and no biliary dilation.  CT scan is pending.  His pain has improved  without any pain medicines.  He has refused pain medicines in the  emergency room so far.   PAST MEDICAL HISTORY:  1. Diabetes.  2. Hypertension.  3. Hypercholesterolemia.  4. History of tonsillectomy.   MEDICINES:  1. Amlodipine/Valsartan (Exforge).  2. Niacin.  3. Fish oil.   ALLERGIES:  NO KNOWN DRUG ALLERGIES.   FAMILY HISTORY:  Mother had small-bowel cancer and bile duct  obstruction.  Maternal grandmother had stomach cancer   SOCIAL HISTORY:  Denies cigarettes, social alcohol use.   REVIEW OF SYSTEMS:  Negative except as stated above.   PHYSICAL EXAMINATION:  VITAL SIGNS:  Temperature 98.4, pulse 16, blood  pressure 143/94, O2 sats 99% on room air.  GENERAL:  Alert.  No acute distress.  CHEST:  Clear to auscultation bilaterally.  CARDIOVASCULAR: Regular rate and rhythm.  ABDOMEN:  Epigastric tenderness with guarding, mild right upper quadrant  tenderness with guarding, positive distention, soft, positive bowel  sounds.   LABORATORY DATA:  White blood count 13.2 up from 6.4 earlier today,  hemoglobin 15.1, platelet count 214,  T bili 0.9, ALP 134, AST 53, ALT  229, lipase 2885, BUN 13, creatinine 0.84, potassium 3.8.   IMPRESSION:  A 50 year old white male presenting with acute pancreatitis  either due to gallstone or from his recent ERCP.  CT scan be will be  done to further evaluate his pancreatic parenchyma and look for any  distal common bile duct abnormalities.  He will be put on bowel rest  with IV fluids.  Will follow his labs closely.  He may need to have  inpatient cholecystectomy if his pancreatitis resolves quickly.  Otherwise, will treat his pancreatitis conservatively and will need to  have surgery in the near future.      Shirley Friar, MD  Electronically Signed     VCS/MEDQ  D:  06/30/2007  T:  07/02/2007  Job:  [id number]   cc:   Petra Kuba, M.D.  Fax: [phone]   Bernette Redbird, M.D.  Fax: [phone]   Emeterio Reeve, MD  Fax: [phone]   Wilmon Arms. Corliss Skains, M.D.  [address] [place] [address] [id number]   [place]

## 2010-10-02 NOTE — Letter (Signed)
July 23, 2007    Wilmon Arms. Corliss Skains, M.D.  [address]  [place] [address]   RE:  [name], [name]  MRN:  [id number]  /  DOB:  1960/09/04   Dear Susy Frizzle:   [name] was seen by our practice when he was admitted for  cholecystectomy.  He was noted to be in atrial fibrillation.  This was  new diagnosis.  The surgery was cancelled.  We did perform a stress  perfusion study and echocardiogram on [name] as an outpatient.  Stress  perfusion study demonstrated no evidence of ischemia or infarct.  His EF  was thought to be slightly low.  However, the echocardiogram  demonstrated some left ventricular hypertrophy but an overall well-  preserved ejection fraction with an EF of 55-60%.  Given this, the  patient will be at acceptable risk for the planned procedure.  He had a  slow ventricular rate despite no medications to control this.  This  will, of course, need to be followed on telemetry and monitored  postprocedure.  We would be happy to be involved if there were any  issues.  I will see [name] afterwards in the office to consider further  management of his atrial fibrillation.   If you have any questions about this gentleman, please do not hesitate  to give me a call.    Sincerely,      Rollene Rotunda, MD, [facility]  Electronically Signed    JH/MedQ  DD: 07/23/2007  DT: 07/24/2007  Job #: [id number]

## 2010-10-02 NOTE — Discharge Summary (Signed)
[name], [name]              ACCOUNT NO.:  [id number]   MEDICAL RECORD NO.:  [id number]          PATIENT TYPE:  INP   LOCATION:  1438                         FACILITY:  [facility]   PHYSICIAN:  Wilmon Arms. Corliss Skains, M.D. DATE OF BIRTH:  [birth year]   DATE OF ADMISSION:  08/23/2007  DATE OF DISCHARGE:  08/26/2007                               DISCHARGE SUMMARY   ADMISSION DIAGNOSIS:  Incarcerated umbilical hernia.   DISCHARGE DIAGNOSIS:  Incarcerated umbilical hernia.   PROCEDURE:  Repair of incarcerated umbilical hernia.   The patient is a 50 year old male who recently underwent a laparoscopic  cholecystectomy with cholangiogram.  The patient had a lot of  postoperative nausea and vomiting.  He had been seen in the office  several times and was noted to have no abdominal tenderness and was  having small bowel movements.  He was actually having more pulmonary  issues, and we empirically treated him for pneumonia.  His chest x-ray  was normal.  However, he presented to the emergency department on August 24, 2007 with severe volume depletion and acute renal insufficiency due  to dehydration.  CT scan of the abdomen showed a loop of small bowel  incarcerated in an umbilical incisional hernia.  The patient is also in  atrial fibrillation and has been evaluated by cardiology and internal  medicine.   The patient was admitted to hospital by Dr. Derrell Lolling, and he underwent an  emergent exploration of his umbilical incision.  The patient had a  single loop of small bowel which was caught in the in the fascial  defect.  The bowel was examined and was viable.  The fascia was then  closed with interrupted #1 Novofil sutures.  Staples were used to close  the skin.  The patient has done very well postoperatively.  His ileus  has resolved quickly.  His renal function and electrolytes have returned  to normal with hydration.  He is tolerating a diet.  His wound looks  good.   DISCHARGE INSTRUCTIONS:   The patient states he is not taking any pain  medication.  He does have Vicodin at home from his previous operation.  He is tolerating a regular diet.  He is being discharged home to follow  up next week with Dr. Corliss Skains for staple removal.  He should call if he  has any further abdominal pain, nausea, vomiting, or wound issues.      Wilmon Arms. Tsuei, M.D.  Electronically Signed     MKT/MEDQ  D:  08/26/2007  T:  08/26/2007  Job:  [id number]

## 2010-10-02 NOTE — Consult Note (Signed)
NAMEJOHANN, SANTONE              ACCOUNT NO.:  [id number]   MEDICAL RECORD NO.:  [id number]          PATIENT TYPE:  OIB   LOCATION:  1443                         FACILITY:  [facility]   PHYSICIAN:  Gerrit Friends. Dietrich Pates, MD, FACCDATE OF BIRTH:  Sep 29, 1960   DATE OF CONSULTATION:  07/15/2007  DATE OF DISCHARGE:                                 CONSULTATION   REFERRING PHYSICIAN:  Wilmon Arms. Tsuei, M.D.   PRIMARY CARDIOLOGIST:  New to [facility], being seen by Dr.  Dietrich Pates.   PRIMARY CARE PHYSICIAN:  Johnn Hai, M.D.   GASTROENTEROLOGIST:  Petra Kuba, M.D.   PATIENT PROFILE:  A 50 year old Caucasian male, with prior history of  hypertension and untreated hyperlipidemia.  He presented for elective  cholecystotomy and was noted to be in atrial fibrillation.   PROBLEM LIST:  1. Atrial fibrillation, presumably new onset.  2. History of cholelithiasis.  Status post ERCP with subsequent      development of pancreatitis.  3. Hypertension.  4. Untreated hyperlipidemia.  5. Obesity.  6. Borderline diabetes.  7. Status post tonsillectomy.   ALLERGIES:  NO KNOWN DRUG ALLERGIES.   HISTORY OF PRESENT ILLNESS:  A 50 year old Caucasian male with the above  problem list.  He is been noting since late December intermittent  episodes of sharp epigastric and abdominal discomfort, generally  occurring after meals and lasting for hours at a time before resolution.  There was no chest pain and no associated shortness of breath or  diaphoresis.  He did have nausea with occasional episodes of vomiting.  Symptoms persisted through January and into early February, and he was  evaluated and noted to have gallstones.  He underwent an ERCP on  June 30, 2007; about 2 hours after ERCP while at home he had the  sudden onset of abdominal discomfort with vomiting.  He was admitted to  [facility] on June 30, 2007 for post-ERCP pancreatitis.  He was subsequently discharged on  July 05, 2007 and presented on  July 15, 2007 for elective cholecystotomy with Dr. Corliss Skains.  When he  was hooked up to the monitor in the pre-surgical holding area, he was  noted to be in atrial fibrillation with rates between 50s and 90s.  He  was completely asymptomatic.  Cardiology was consulted.   On my questioning, he denies any history of dyspnea on exertion or  significant limitations in activities.  He denies any palpitations,  lightheadedness or syncope.  He does note one episode of chest tightness  that occurred shortly after working out on an elliptical, along with  some weight training on July 13, 2007.  The tightness in his chest  was somewhat different from his epigastric discomfort, and lasted for  about 2-3 hours prior to resolving spontaneously.  He rated it as a 4  out of 10 on the pain scale.  He had no associated symptoms.  Again, he  is currently asymptomatic.   HOME MEDICATIONS:  1. Exforge (Norvasc/valsartan) 10/320 mg daily.  2. Niacin 100 mg daily.  3. Fish oil one tablet daily.   FAMILY HISTORY:  There is no prior history of coronary disease or  stroke.  His paternal grandmother had diabetes.  His parents died of  cancer in their 26s.   SOCIAL HISTORY:  The patient is married and lives [place] his [place]  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  He denies any tobacco, alcohol or drug use.  He occasionally  exercises, but not as often as he should.  He does not adhere to any  sort of diet.   REVIEW OF SYSTEMS:  Positive for epigastric discomfort, along with  nausea, vomiting and chest tightness; all as outlined in the HPI.  Otherwise, all systems reviewed are negative.   PHYSICAL EXAMINATION:  Temperature 98.2, heart rate 78, respirations 20,  blood pressure 122/88, pulse oximetry 96% on room air, weight 105.2 kg.  A pleasant white male, in no acute distress.  Awake, alert and oriented  x3.  HEENT:   Normal.  NEUROLOGIC:  Grossly intact, nonfocal.  SKIN:  Warm and dry without lesions or masses.  NECK:  No bruits or JVD.  LUNGS:  Respirations regular and unlabored.  Clear to auscultation.  CARDIAC:  Regular S1 and S2.  No S3, S4 or murmurs.  ABDOMEN:  Round, soft; with mild epigastric discomfort and tenderness.  Bowel sounds present x4.  EXTREMITIES:  Warm, dry, pink.  No clubbing, cyanosis or edema.  Dorsalis pedis and posterior tibial pulses 2+ and equal bilaterally.   LAB WORK:  Hemoglobin 13.8, hematocrit 39.6, WBC 5.7, platelets 286.  Sodium 143, potassium 3.9, chloride 106, CO2 32, BUN 14, creatinine  0.96, glucose 116.  Total bilirubin 0.7, alkaline phosphatase 78, AST  28, ALT 38.  Total protein 7.2, albumin 3.6, calcium 9.0, magnesium 2.1.  Amylase 52, hemoglobin A1c 6.6.  Lipase 30.  CK 124, MB 1.7.  Troponin I  0.03.  Total cholesterol 172, triglycerides 224, HDL 22, LDL 105.  TSH  1.450.   ASSESSMENT AND PLAN:  1. Atrial fibrillation.  Presumably new onset.  The patient was      admitted to [facility] between February 10 and July 05, 2007.  There was no mention of atrial fibrillation at that      time.  Presumably this occurred since July 05, 2007.  The      patient is asymptomatic and his rates are generally hovering in the      70s to 90s, with occasional dips into the 50s.  Will add a low-dose      beta blocker at this time, and we have checked TSH and magnesium.      We will plan on a 2-D echocardiogram to evaluate left ventricular      function as well as atrial size.  We will rule out structural      abnormalities.  Will hold off on anticoagulation at this time, as      our goal is to get this gentleman through surgery.  We will      consider a Myoview, given his history of chest tightness.  Dr.      Dietrich Pates is to see the patient and discuss further.  2. Hypertension.  Continue his Exforge.  3. Hyperlipidemia.  Consider usage of statin  therapy, once he is      through with his gastrointestinal procedures.  4. Obesity.  Exercise is encouraged.      Nicolasa Ducking, ANP  Gerrit Friends. Dietrich Pates, MD, [facility]  Electronically Signed    CB/MEDQ  D:  07/16/2007  T:  07/16/2007  Job:  [id number]   cc:   Dr. Johnn Hai   Petra Kuba, M.D.  Fax: [phone]

## 2010-10-02 NOTE — Discharge Summary (Signed)
[name], [name]              ACCOUNT NO.:  [id number]   MEDICAL RECORD NO.:  [id number]          PATIENT TYPE:  INP   LOCATION:  1619                         FACILITY:  [facility]   PHYSICIAN:  Graylin Shiver, M.D.   DATE OF BIRTH:  02/17/1961   DATE OF ADMISSION:  06/30/2007  DATE OF DISCHARGE:  07/05/2007                               DISCHARGE SUMMARY   DATE OF ADMISSION:  June 30, 2007   DATE OF DISCHARGE:  July 05, 2007   REASON FOR ADMISSION:  The patient is a 50 year old male who was  admitted to the hospital with post ERCP pancreatitis. The patient had  had a ERCP with sphincterotomy due to suspected CBD stone, postprocedure  the patient did well. However, shortly after going home in eating he  developed acute onset of epigastric pain and presented back to the  hospital where his lipase was found to be elevated at 28, 85. He was  admitted to the hospital with acute pancreatitis either felt secondary  to post ERCP or gallstones.   PHYSICAL:  See H&P.   HOSPITAL COURSE:  The patient was treated conservatively with medical  management in the hospital. He slowly improved. On July 04, 2007, he  was feeling good with only a little bit of abdominal discomfort.  His  amylase and lipase were normal on July 04, 2007. He tolerated a full  liquid on that day. On the following day July 05, 2007, he was  feeling good.  His abdomen was soft, nontender and he was felt  appropriate for discharge.   IMPRESSION:  1. Post ERCP pancreatitis.  2. Gallstones.   PLAN:  The patient is discharged home. Instructed to follow-up with Dr.  Manus Rudd for planning laparoscopic cholecystectomy.           ______________________________  Graylin Shiver, M.D.     SFG/MEDQ  D:  07/31/2007  T:  08/01/2007  Job:  [id number]   cc:   Petra Kuba, M.D.  Fax: [phone]   Emeterio Reeve, MD  Fax: [phone]   Wilmon Arms. Corliss Skains, M.D.  [address] [place] [address] [id number]  [place]

## 2010-10-05 NOTE — Discharge Summary (Signed)
[name], [name]              ACCOUNT NO.:  [id number]   MEDICAL RECORD NO.:  [id number]          PATIENT TYPE:  INP   LOCATION:  3704                         FACILITY:  [facility]   PHYSICIAN:  Lonia Blood, M.D.      DATE OF BIRTH:  May 22, 1960   DATE OF ADMISSION:  05/17/2004  DATE OF DISCHARGE:  05/19/2004                                 DISCHARGE SUMMARY   PRIMARY CARE PHYSICIAN:  Unassigned   DISCHARGE DIAGNOSES:  1.  Community acquired pneumonia right infrahilar area.  2.  Hypertension.  3.  Dyslipidemia.  4.  Obesity.  5.  Increased CBGs with pre diabetes.   DISCHARGE MEDICATIONS:  1.  Avelox 400 mg daily for five days.  2.  Patient is to resume his home antihypertensives including atenolol 50 mg      daily and probably lisinopril 10 mg daily.   DISPOSITION:  Patient currently has no primary care physician but he has  insurance and he has been given a list of primary care physicians to choose  from.  Says he is going to make his own decision once he gets home.  He will  probably see one of the physicians that family member is seeing already.  He  has, however, not decided at this point.   PROCEDURES PERFORMED:  1.  Chest x-ray performed on May 17, 2004 that shows right infrahilar      opacity which may be related to atelectasis or infiltrate but imaging on      resolution is recommended to exclude neoplasm, also diffuse interstitial      coarsening, likely chronic, although an early component interstitial      pulmonary edema was consideration.  2.  Follow-up chest CT performed on May 18, 2004 shows change of      bronchitis with mild patchy peribronchial periinfiltrative changes      within the lingula and left lower lobe.  Prominent central pulmonary      arteries and central hilar vasculature which produced the appearance      noted on the recent chest x-ray and also fatty changes in the liver.   CONSULTS:  None.   BRIEF HISTORY AND PHYSICAL:  Please  refer to dictated history and physical  by Dr. Ricke Hey.  It showed, however, this is a 50 year old white male with  history of long-standing hypertension and obesity presenting with nasal  congestion, cough, shortness of breath for two days.  Patient also had fever  102 and chills.  He went to an urgent care center from where he was referred  to the emergency room.  While in the emergency room patient was found to  have a temperature 102.9.  He was sweaty.  Also had bilateral expiratory  wheezing and some crackles.  His chest x-ray was indicative of findings  above.  Hence, patient was admitted for further work-up.  His admission  laboratories included white count of 6.7, hemoglobin 13.9, platelets 193.   HOSPITAL COURSE:  #1 - PNEUMONIA:  Patient was admitted for management of  community acquired pneumonia.  He was started on  some IV fluids.  Blood  cultures were drawn which were negative.  Sputum cultures were also negative  as of this point.  He was started on Rocephin, Zithromax, and q.4h.  nebulizer with supplemental oxygen.  A follow-up of his chest x-ray was  performed due to the findings as indicated before the infrahilar opacity.  Subsequent CT scan indicated that the infrahilar opacity was mainly  vasculature.  Patient responded very well.  By the first day of admission he  was afebrile and at the time of discharge patient is asymptomatic.  He is to  complete five days of antibiotic therapy.   #2 - HYPERTENSION:  Patient gives a history of taking antihypertensives,  atenolol and another medication, probably hydrochlorothiazide combination.  He was placed on atenolol as well as lisinopril while in the hospital and he  did very well.  His blood pressures have been mainly in the 120s, between  120-140s.   #3 - DYSLIPIDEMIA:  Patient's lipid panel was checked with total cholesterol  257, triglycerides 151, HDL 33, LDL 184.  Stringent counseling was performed  on the patient with  the need for him to manage his cholesterol especially  with his history of obesity.  Patient informed me that he has tried Lipitor  before which did not work for him because he was having muscle cramps so he  quit.  He has also quit exercise and has been eating a lot of [employer].  We discussed at length and he wants to try health and lifestyle modification  for a while and he wants to get a new primary care physician.  If all that  fails in three to six months then he will consider Statin and other  medication like Niacin.   #4 - INCREASED CBGs:  Patient's CBGs were consistently increased up to 170.  Hemoglobin A1C was checked which was 6.6 indicating that patient may have  had long-standing increase in CBGs.  He has family history of diabetes and I  have also discussed with him he might be pre diabetic at this point.  He  wants to try lifestyle modification and if all fails he will have some  follow-up with his CBGs including a fasting blood sugar.      Lawa   LG/MEDQ  D:  05/19/2004  T:  05/19/2004  Job:  [id number]

## 2010-10-05 NOTE — H&P (Signed)
[name], [name] NO.:  [id number]   MEDICAL RECORD NO.:  [id number]          PATIENT TYPE:  EMS   LOCATION:  MAJO                         FACILITY:  [facility]   PHYSICIAN:  Isidor Holts, M.D.  DATE OF BIRTH:  02/14/1961   DATE OF ADMISSION:  05/17/2004  DATE OF DISCHARGE:                                HISTORY & PHYSICAL   PRIMARY CARE PHYSICIAN:  Unassigned.   CHIEF COMPLAINT:  Nasal congestion, cough, and shortness of breath for two  days.   HISTORY OF PRESENT ILLNESS:  This is a 50 year old male with a known history  of hypertension and dyslipidemia.  Apparently two days ago he developed a  cold characterized by nasal congestion and cough productive of greenish  phlegm.  He utilized over-the-counter medication, including Tylenol Cold &  Flu without much improvement.  Subsequently developed a fever of 102 degrees  and chills.  Denies chest pain but did have some shortness of breath.  He  eventually went to the Urgent Care Center, [facility], on May 17, 2004,  for a blood pressure check.  He was found to be unwell and have somewhat  abnormal EKG and was referred to the emergency department.   PAST MEDICAL HISTORY:  1.  Hypertension for five to six years.  2.  Obesity.  3.  Dyslipidemia.  4.  Status post tonsillectomy at age 37 years.   MEDICATIONS:  1.  Atenolol 50 p.o. daily.  2.  Antihypertensive combination with HCTZ.  The patient does not remember      the name.   ALLERGIES:  No known drug allergies.   SOCIAL AND FAMILY HISTORY:  The patient is a Research scientist (life sciences).  Nonsmoker, nondrinker.  Has no history of drug abuse.  He did have a  remote history of marijuana use, years ago.  He is married with four  offspring, all of whom are alive and well.  His siblings are alive and well.  His mother is 1 years with intestinal cancer and hypertension.  His father  died of a brain tumor.   REVIEW OF SYSTEMS:  As in Chief  Complaint and History of Present Illness,  otherwise, negative.   PHYSICAL EXAMINATION:  VITAL SIGNS: Temperature 102.9, pulse 93 per minute  and regular, respiratory rate 20, blood pressure 147/77 mmHg.  Pulse  oximetry 96% on room air.  The patient is not short of breath at rest.  He  is, however, sweaty and warm to touch, talking complete sentences.  HEENT:  No clinical pallor, no jaundice.  No conjunctival injection.  Throat  is quite clear.  NECK:  Supple.  JVP not seen.  No palpable lymphadenopathy.  No palpable  carotid bruits.  CHEST:  Bilateral expiratory wheeze, no crackles, however.  Heart sounds 1  and 2 heard, normal rigor, no murmurs.  ABDOMEN:  Obese, soft, and nontender.  There is no palpable organomegaly, no  palpable masses.  Normal bowel sounds.  EXTREMITIES:  Lower extremity exam shows no pitting edema, palpable  peripheral pulses.  MUSCULOSKELETAL:  System is unremarkable.  CENTRAL  NERVOUS SYSTEM:  No focal neurologic deficits on gross examination.   INVESTIGATIONS:  CBC:  WBC 6.7, hemoglobin 13.9, hematocrit 39.8, platelets  193.  Creatinine 1.0.   EKG shows sinus rhythm, regular x 4 per minute with nonspecific T wave  changes.   Chest x-ray shows a right infrahilar opacity reported as atelectasis Vs  infiltrate, however, interval imaging is recommended to rule out possible  neoplasm.   IMPRESSION AND PLAN:  1.  Community-acquired pneumonia.  Will admit patient, administer IV fluids      for hydration, antipyretics if needed.  Will obtain blood cultures and      treat with intravenous Rocephin and azithromycin to cover community-      acquired respiratory pathogens.  Because of diffuse wheeze on      auscultation of the chest, will place patient on q.4h. bronchodilator      nebulizers for now as well as supplemental oxygen.  2.  History of hypertension.  Blood pressure is mildly elevated. Will      continue pre-admission atenolol and add Lisinopril 10 mg  daily for now      and monitor.  3.  History of dyslipidemia.  Will check fasting lipids and thyroid profile.  4.  Abnormal chest x-ray findings.  Will arrange chest CT scan in the a.m.      of May 18, 2004, to further elucidate anatomy.  Meanwhile, will      institute deep vein thrombosis/gastrointestinal prophylaxis.  Further management will depend on clinical course.      Chri   CO/MEDQ  D:  05/17/2004  T:  05/17/2004  Job:  [id number]

## 2010-11-30 ENCOUNTER — Encounter: Payer: Self-pay | Admitting: Cardiovascular Disease

## 2011-02-08 LAB — COMPREHENSIVE METABOLIC PANEL
ALT: 114 — ABNORMAL HIGH
ALT: 180 — ABNORMAL HIGH
ALT: 229 — ABNORMAL HIGH
ALT: 38
ALT: 55 — ABNORMAL HIGH
AST: 26
AST: 53 — ABNORMAL HIGH
AST: 24
Albumin: 2.9 — ABNORMAL LOW
Albumin: 2.9 — ABNORMAL LOW
Albumin: 3.5
Albumin: 3.9
Albumin: 2.7 — ABNORMAL LOW
Alkaline Phosphatase: 118 — ABNORMAL HIGH
Alkaline Phosphatase: 136 — ABNORMAL HIGH
Alkaline Phosphatase: 99
Alkaline Phosphatase: 78
Alkaline Phosphatase: 91
BUN: 10
BUN: 10
BUN: 9
BUN: 17
BUN: 7
CO2: 27
CO2: 29
CO2: 30
Calcium: 8.5
Calcium: 8.8
Calcium: 8.1 — ABNORMAL LOW
Calcium: 9
Chloride: 103
Chloride: 106
Chloride: 102
Creatinine, Ser: 0.79
Creatinine, Ser: 0.83
Creatinine, Ser: 0.84
Creatinine, Ser: 0.77
GFR calc Af Amer: >60
GFR calc Af Amer: >60
GFR calc Af Amer: >60
GFR calc Af Amer: >60
GFR calc non Af Amer: >60
GFR calc non Af Amer: >60
GFR calc non Af Amer: >60
GFR calc non Af Amer: >60
GFR calc non Af Amer: >60
Glucose, Bld: 124 — ABNORMAL HIGH
Glucose, Bld: 137 — ABNORMAL HIGH
Glucose, Bld: 113 — ABNORMAL HIGH
Potassium: 3.6
Potassium: 4
Potassium: 3.4 — ABNORMAL LOW
Potassium: 4.2
Sodium: 136
Sodium: 138
Sodium: 139
Sodium: 139
Total Bilirubin: 0.8
Total Bilirubin: 1.2
Total Bilirubin: 0.9
Total Protein: 6
Total Protein: 7.4
Total Protein: 6
Total Protein: 7.2

## 2011-02-08 LAB — CBC
HCT: 37.4 — ABNORMAL LOW
HCT: 41.7
HCT: 41.8
HCT: 39.5
HCT: 39.6
Hemoglobin: 14.3
Hemoglobin: 12.7 — ABNORMAL LOW
Hemoglobin: 13.8
MCHC: 34.2
MCHC: 34.4
MCHC: 34.6
MCHC: 34.9
MCV: 82.1
MCV: 82.2
MCV: 82.8
MCV: 82.5
Platelets: 164
Platelets: 173
Platelets: 203
Platelets: 214
Platelets: 198
RBC: 5.03
RBC: 4.78
RBC: 4.8
RDW: 14.2
RDW: 14.7
RDW: 13.9
RDW: 14
WBC: 12.8 — ABNORMAL HIGH
WBC: 10.7 — ABNORMAL HIGH

## 2011-02-08 LAB — BASIC METABOLIC PANEL
BUN: 14
CO2: 32
Chloride: 106
GFR calc non Af Amer: >60
Glucose, Bld: 116 — ABNORMAL HIGH
Potassium: 3.9
Sodium: 143

## 2011-02-08 LAB — DIFFERENTIAL
Basophils Absolute: 0
Basophils Absolute: 0
Basophils Absolute: 0
Basophils Relative: 0
Basophils Relative: 0
Basophils Relative: 1
Basophils Relative: 0
Basophils Relative: 1
Eosinophils Absolute: 0
Eosinophils Absolute: 0
Eosinophils Absolute: 0.1
Eosinophils Absolute: 0.1
Eosinophils Relative: 0
Eosinophils Relative: 2
Eosinophils Relative: 2
Lymphocytes Relative: 16
Lymphocytes Relative: 9 — ABNORMAL LOW
Lymphocytes Relative: 25
Lymphocytes Relative: 25
Lymphs Abs: 1.2
Lymphs Abs: 2
Lymphs Abs: 2.8
Monocytes Absolute: 0.4
Monocytes Absolute: 0.9
Monocytes Absolute: 1
Monocytes Absolute: 0.8
Monocytes Relative: 3
Monocytes Relative: 7
Monocytes Relative: 9
Neutro Abs: 10.9 — ABNORMAL HIGH
Neutro Abs: 9.6 — ABNORMAL HIGH
Neutro Abs: 9.9 — ABNORMAL HIGH
Neutro Abs: 2.4
Neutro Abs: 5.4
Neutrophils Relative %: 47
Neutrophils Relative %: 75
Neutrophils Relative %: 79 — ABNORMAL HIGH
Neutrophils Relative %: 42 — ABNORMAL LOW
Neutrophils Relative %: 63

## 2011-02-08 LAB — LIPID PANEL
Cholesterol: 172
LDL Cholesterol: 105 — ABNORMAL HIGH
Total CHOL/HDL Ratio: 7.8

## 2011-02-08 LAB — AMYLASE
Amylase: 714 — ABNORMAL HIGH
Amylase: 58
Amylase: 85

## 2011-02-08 LAB — CARDIAC PANEL(CRET KIN+CKTOT+MB+TROPI)
CK, MB: 1.4
CK, MB: 1.7
Relative Index: 1.3
Relative Index: 1.4

## 2011-02-08 LAB — HEMOGLOBIN A1C
Hgb A1c MFr Bld: 6.6 — ABNORMAL HIGH
Mean Plasma Glucose: 158

## 2011-02-08 LAB — PROTIME-INR
INR: 0.9
Prothrombin Time: 12.1

## 2011-02-08 LAB — LIPASE, BLOOD
Lipase: 1623 — ABNORMAL HIGH
Lipase: 2885 — ABNORMAL HIGH
Lipase: 55
Lipase: 28
Lipase: 30

## 2011-02-08 LAB — TYPE AND SCREEN
ABO/RH(D): A POS
Antibody Screen: NEGATIVE

## 2011-02-08 LAB — CK TOTAL AND CKMB (NOT AT ARMC)
CK, MB: 2.3
Total CK: 148

## 2011-02-08 LAB — TSH: TSH: 1.45

## 2011-02-11 LAB — URINALYSIS, ROUTINE W REFLEX MICROSCOPIC
Nitrite: NEGATIVE
Protein, ur: NEGATIVE
Urobilinogen, UA: 0.2

## 2011-02-11 LAB — CBC
Hemoglobin: 14.6
MCHC: 34.7
RBC: 5.08

## 2011-02-11 LAB — DIFFERENTIAL
Basophils Absolute: 0
Basophils Relative: 0
Lymphocytes Relative: 43
Neutro Abs: 2.6
Neutrophils Relative %: 47

## 2011-02-11 LAB — COMPREHENSIVE METABOLIC PANEL
ALT: 27
AST: 24
Alkaline Phosphatase: 65
CO2: 32
Calcium: 9.1
GFR calc Af Amer: >60
GFR calc non Af Amer: >60
Glucose, Bld: 122 — ABNORMAL HIGH
Potassium: 4.1
Sodium: 141
Total Protein: 7.3

## 2011-02-11 LAB — LIPASE, BLOOD: Lipase: 22

## 2011-02-11 LAB — AMYLASE: Amylase: 30

## 2011-02-12 LAB — CBC
HCT: 35.4 — ABNORMAL LOW
Hemoglobin: 12 — ABNORMAL LOW
Hemoglobin: 12.8 — ABNORMAL LOW
Hemoglobin: 16.4
MCHC: 34.8
MCHC: 34.9
MCHC: 35.6
MCV: 83.8
MCV: 84
Platelets: 165
RBC: 4 — ABNORMAL LOW
RBC: 5.63
RDW: 12.8
RDW: 12.8
WBC: 9
WBC: 9.9

## 2011-02-12 LAB — DIFFERENTIAL
Basophils Absolute: 0
Basophils Absolute: 0.1
Basophils Relative: 0
Basophils Relative: 0
Eosinophils Absolute: 0.1
Eosinophils Absolute: 0.4
Lymphocytes Relative: 23
Lymphs Abs: 2.3
Lymphs Abs: 2.6
Monocytes Absolute: 0.8
Monocytes Relative: 9
Neutro Abs: 6.3
Neutrophils Relative %: 63
Neutrophils Relative %: 78 — ABNORMAL HIGH

## 2011-02-12 LAB — BASIC METABOLIC PANEL
BUN: 24 — ABNORMAL HIGH
CO2: 24
CO2: 27
Calcium: 7.1 — ABNORMAL LOW
Calcium: 7.2 — ABNORMAL LOW
Calcium: 7.3 — ABNORMAL LOW
Chloride: 103
Creatinine, Ser: 1.07
Creatinine, Ser: 1.45
Creatinine, Ser: 1.79 — ABNORMAL HIGH
GFR calc Af Amer: >60
GFR calc Af Amer: >60
GFR calc non Af Amer: 52 — ABNORMAL LOW
GFR calc non Af Amer: >60
Glucose, Bld: 114 — ABNORMAL HIGH
Glucose, Bld: 143 — ABNORMAL HIGH
Potassium: 4.5
Potassium: 4.5
Sodium: 126 — ABNORMAL LOW
Sodium: 136

## 2011-02-12 LAB — URINALYSIS, ROUTINE W REFLEX MICROSCOPIC
Glucose, UA: NEGATIVE
Ketones, ur: 15 — AB
Nitrite: NEGATIVE
Protein, ur: 30 — AB
Urobilinogen, UA: 1

## 2011-02-12 LAB — COMPREHENSIVE METABOLIC PANEL
ALT: 62 — ABNORMAL HIGH
Alkaline Phosphatase: 84
CO2: 25
Calcium: 8.3 — ABNORMAL LOW
GFR calc non Af Amer: 30 — ABNORMAL LOW
Glucose, Bld: 234 — ABNORMAL HIGH
Potassium: 3 — ABNORMAL LOW
Sodium: 124 — ABNORMAL LOW

## 2011-02-12 LAB — URINE CULTURE: Colony Count: NO GROWTH

## 2011-02-12 LAB — POCT CARDIAC MARKERS
CKMB, poc: 10.9
Troponin i, poc: <0.05

## 2011-02-12 LAB — TYPE AND SCREEN: ABO/RH(D): A POS

## 2011-02-12 LAB — URINE MICROSCOPIC-ADD ON

## 2011-02-12 LAB — LIPASE, BLOOD: Lipase: 67 — ABNORMAL HIGH

## 2011-11-14 ENCOUNTER — Other Ambulatory Visit: Payer: Self-pay | Admitting: Gastroenterology

## 2013-06-13 ENCOUNTER — Other Ambulatory Visit: Payer: Self-pay | Admitting: *Deleted

## 2013-06-13 DIAGNOSIS — Z79899 Other long term (current) drug therapy: Secondary | ICD-10-CM

## 2013-06-13 DIAGNOSIS — I4891 Unspecified atrial fibrillation: Secondary | ICD-10-CM

## 2013-07-12 ENCOUNTER — Encounter: Payer: Self-pay | Admitting: Cardiology

## 2013-07-15 ENCOUNTER — Ambulatory Visit: Payer: Self-pay | Admitting: Cardiology

## 2013-07-20 ENCOUNTER — Other Ambulatory Visit: Payer: Self-pay

## 2013-08-29 ENCOUNTER — Encounter: Payer: Self-pay | Admitting: *Deleted

## 2015-01-02 DIAGNOSIS — A419 Sepsis, unspecified organism: Secondary | ICD-10-CM

## 2015-01-02 DIAGNOSIS — J189 Pneumonia, unspecified organism: Secondary | ICD-10-CM

## 2015-01-02 HISTORY — DX: Sepsis, unspecified organism: A41.9

## 2015-01-02 HISTORY — DX: Pneumonia, unspecified organism: J18.9

## 2015-01-03 DIAGNOSIS — E876 Hypokalemia: Secondary | ICD-10-CM | POA: Insufficient documentation

## 2015-01-03 HISTORY — DX: Hypokalemia: E87.6

## 2015-01-22 DIAGNOSIS — S2242XA Multiple fractures of ribs, left side, initial encounter for closed fracture: Secondary | ICD-10-CM | POA: Insufficient documentation

## 2015-01-22 DIAGNOSIS — S36899A Unspecified injury of other intra-abdominal organs, initial encounter: Secondary | ICD-10-CM

## 2015-01-22 DIAGNOSIS — J942 Hemothorax: Secondary | ICD-10-CM

## 2015-01-22 DIAGNOSIS — Z7901 Long term (current) use of anticoagulants: Secondary | ICD-10-CM

## 2015-01-22 DIAGNOSIS — S22008A Other fracture of unspecified thoracic vertebra, initial encounter for closed fracture: Secondary | ICD-10-CM

## 2015-01-22 DIAGNOSIS — S22009A Unspecified fracture of unspecified thoracic vertebra, initial encounter for closed fracture: Secondary | ICD-10-CM | POA: Insufficient documentation

## 2015-01-22 DIAGNOSIS — D62 Acute posthemorrhagic anemia: Secondary | ICD-10-CM

## 2015-01-22 DIAGNOSIS — S42109A Fracture of unspecified part of scapula, unspecified shoulder, initial encounter for closed fracture: Secondary | ICD-10-CM

## 2015-01-22 HISTORY — DX: Other fracture of unspecified thoracic vertebra, initial encounter for closed fracture: S22.008A

## 2015-01-22 HISTORY — DX: Unspecified fracture of unspecified thoracic vertebra, initial encounter for closed fracture: S22.009A

## 2015-01-22 HISTORY — DX: Long term (current) use of anticoagulants: Z79.01

## 2015-01-22 HISTORY — DX: Rider (driver) (passenger) of other motorcycle injured in unspecified traffic accident, initial encounter: V29.99XA

## 2015-01-22 HISTORY — DX: Hemothorax: J94.2

## 2015-01-22 HISTORY — DX: Fracture of unspecified part of scapula, unspecified shoulder, initial encounter for closed fracture: S42.109A

## 2015-01-22 HISTORY — DX: Acute posthemorrhagic anemia: D62

## 2015-01-22 HISTORY — DX: Multiple fractures of ribs, left side, initial encounter for closed fracture: S22.42XA

## 2015-01-22 HISTORY — DX: Unspecified injury of other intra-abdominal organs, initial encounter: S36.899A

## 2015-03-03 DIAGNOSIS — R310 Gross hematuria: Secondary | ICD-10-CM | POA: Insufficient documentation

## 2015-03-03 HISTORY — DX: Gross hematuria: R31.0

## 2017-09-11 DIAGNOSIS — E785 Hyperlipidemia, unspecified: Secondary | ICD-10-CM | POA: Insufficient documentation
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
## 2017-09-15 ENCOUNTER — Encounter: Payer: Self-pay | Admitting: Cardiology

## 2017-09-15 ENCOUNTER — Ambulatory Visit (INDEPENDENT_AMBULATORY_CARE_PROVIDER_SITE_OTHER): Payer: TRICARE For Life (TFL) | Admitting: Cardiology

## 2017-09-15 VITALS — BP 148/96 | HR 57 | Ht 69.0 in | Wt 231.1 lb

## 2017-09-15 DIAGNOSIS — Z7901 Long term (current) use of anticoagulants: Secondary | ICD-10-CM | POA: Diagnosis not present

## 2017-09-15 DIAGNOSIS — I119 Hypertensive heart disease without heart failure: Secondary | ICD-10-CM | POA: Diagnosis not present

## 2017-09-15 DIAGNOSIS — I482 Chronic atrial fibrillation, unspecified: Secondary | ICD-10-CM

## 2017-09-15 NOTE — Patient Instructions (Signed)
Medication Instructions:  Your physician recommends that you continue on your current medications as directed. Please refer to the Current Medication list given to you today.  Labwork: None  Testing/Procedures: You had an EKG today.  Your physician has requested that you have an echocardiogram. Echocardiography is a painless test that uses sound waves to create images of your heart. It provides your doctor with information about the size and shape of your heart and how well your heart's chambers and valves are working. This procedure takes approximately one hour. There are no restrictions for this procedure.  Follow-Up: Your physician wants you to follow-up in: 6 months. You will receive a reminder letter in the mail two months in advance. If you don't receive a letter, please call our office to schedule the follow-up appointment.  Any Other Special Instructions Will Be Listed Below (If Applicable).     If you need a refill on your cardiac medications before your next appointment, please call your pharmacy.   

## 2017-10-09 ENCOUNTER — Other Ambulatory Visit (HOSPITAL_BASED_OUTPATIENT_CLINIC_OR_DEPARTMENT_OTHER): Payer: Self-pay

## 2017-10-14 ENCOUNTER — Ambulatory Visit (HOSPITAL_BASED_OUTPATIENT_CLINIC_OR_DEPARTMENT_OTHER)
Admission: RE | Admit: 2017-10-14 | Discharge: 2017-10-14 | Disposition: A | Discharge status: Home / Self Care | Source: Ambulatory Visit | Attending: Cardiology | Admitting: Cardiology

## 2017-10-14 DIAGNOSIS — E119 Type 2 diabetes mellitus without complications: Secondary | ICD-10-CM | POA: Diagnosis not present

## 2017-10-14 DIAGNOSIS — E785 Hyperlipidemia, unspecified: Secondary | ICD-10-CM | POA: Diagnosis not present

## 2017-10-14 DIAGNOSIS — I517 Cardiomegaly: Secondary | ICD-10-CM | POA: Diagnosis not present

## 2017-10-14 DIAGNOSIS — I482 Chronic atrial fibrillation, unspecified: Secondary | ICD-10-CM

## 2017-10-14 DIAGNOSIS — I081 Rheumatic disorders of both mitral and tricuspid valves: Secondary | ICD-10-CM | POA: Diagnosis not present

## 2017-10-14 DIAGNOSIS — I10 Essential (primary) hypertension: Secondary | ICD-10-CM | POA: Diagnosis not present

## 2017-10-14 NOTE — Progress Notes (Signed)
Echocardiogram 2D Echocardiogram has been performed.  Dorothey Baseman 10/14/2017, 9:17 AM
